# Patient Record
Sex: Male | Born: 1965 | Race: Black or African American | Hispanic: No | Marital: Single | Smoking: Current every day smoker
Health system: Southern US, Community
[De-identification: ages and names within clinical notes are randomized; demographics above are authoritative.]

## PROBLEM LIST (undated history)

## (undated) DIAGNOSIS — I10 Essential (primary) hypertension: Secondary | ICD-10-CM

## (undated) DIAGNOSIS — E119 Type 2 diabetes mellitus without complications: Secondary | ICD-10-CM

## (undated) DIAGNOSIS — I671 Cerebral aneurysm, nonruptured: Secondary | ICD-10-CM

## (undated) HISTORY — PX: CEREBRAL ANEURYSM REPAIR: SHX164

---

## 2011-06-01 ENCOUNTER — Emergency Department (HOSPITAL_COMMUNITY)
Admission: EM | Admit: 2011-06-01 | Discharge: 2011-06-01 | Disposition: A | Discharge status: Home / Self Care | Payer: Self-pay | Attending: Emergency Medicine | Admitting: Emergency Medicine

## 2011-06-01 ENCOUNTER — Emergency Department (HOSPITAL_COMMUNITY): Payer: Self-pay

## 2011-06-01 DIAGNOSIS — Z8679 Personal history of other diseases of the circulatory system: Secondary | ICD-10-CM | POA: Insufficient documentation

## 2011-06-01 DIAGNOSIS — R51 Headache: Secondary | ICD-10-CM | POA: Insufficient documentation

## 2011-06-01 DIAGNOSIS — R112 Nausea with vomiting, unspecified: Secondary | ICD-10-CM | POA: Insufficient documentation

## 2011-06-01 DIAGNOSIS — I10 Essential (primary) hypertension: Secondary | ICD-10-CM | POA: Insufficient documentation

## 2011-07-27 ENCOUNTER — Ambulatory Visit (HOSPITAL_COMMUNITY): Payer: Medicaid Other

## 2011-07-27 ENCOUNTER — Inpatient Hospital Stay (HOSPITAL_COMMUNITY)
Admission: EM | Admit: 2011-07-27 | Discharge: 2011-07-29 | DRG: 305 | Disposition: A | Discharge status: Home / Self Care | Payer: Medicaid Other | Attending: Family Medicine | Admitting: Family Medicine

## 2011-07-27 ENCOUNTER — Inpatient Hospital Stay (INDEPENDENT_AMBULATORY_CARE_PROVIDER_SITE_OTHER)
Admission: RE | Admit: 2011-07-27 | Discharge: 2011-07-27 | Disposition: A | Discharge status: Home / Self Care | Payer: Medicaid Other | Source: Ambulatory Visit | Attending: Family Medicine | Admitting: Family Medicine

## 2011-07-27 ENCOUNTER — Emergency Department (HOSPITAL_COMMUNITY): Payer: Medicaid Other

## 2011-07-27 ENCOUNTER — Encounter (HOSPITAL_COMMUNITY): Payer: Self-pay | Admitting: Family Medicine

## 2011-07-27 DIAGNOSIS — E662 Morbid (severe) obesity with alveolar hypoventilation: Secondary | ICD-10-CM | POA: Diagnosis present

## 2011-07-27 DIAGNOSIS — I671 Cerebral aneurysm, nonruptured: Secondary | ICD-10-CM

## 2011-07-27 DIAGNOSIS — Z8249 Family history of ischemic heart disease and other diseases of the circulatory system: Secondary | ICD-10-CM

## 2011-07-27 DIAGNOSIS — I10 Essential (primary) hypertension: Principal | ICD-10-CM | POA: Diagnosis present

## 2011-07-27 DIAGNOSIS — E119 Type 2 diabetes mellitus without complications: Secondary | ICD-10-CM | POA: Diagnosis present

## 2011-07-27 DIAGNOSIS — R0600 Dyspnea, unspecified: Secondary | ICD-10-CM

## 2011-07-27 DIAGNOSIS — R079 Chest pain, unspecified: Secondary | ICD-10-CM

## 2011-07-27 DIAGNOSIS — R609 Edema, unspecified: Secondary | ICD-10-CM

## 2011-07-27 DIAGNOSIS — R0902 Hypoxemia: Secondary | ICD-10-CM

## 2011-07-27 DIAGNOSIS — R0602 Shortness of breath: Secondary | ICD-10-CM

## 2011-07-27 DIAGNOSIS — F17200 Nicotine dependence, unspecified, uncomplicated: Secondary | ICD-10-CM | POA: Diagnosis present

## 2011-07-27 DIAGNOSIS — R7989 Other specified abnormal findings of blood chemistry: Secondary | ICD-10-CM | POA: Diagnosis present

## 2011-07-27 DIAGNOSIS — R0989 Other specified symptoms and signs involving the circulatory and respiratory systems: Secondary | ICD-10-CM

## 2011-07-27 DIAGNOSIS — R0609 Other forms of dyspnea: Secondary | ICD-10-CM

## 2011-07-27 DIAGNOSIS — R600 Localized edema: Secondary | ICD-10-CM

## 2011-07-27 DIAGNOSIS — I872 Venous insufficiency (chronic) (peripheral): Secondary | ICD-10-CM | POA: Diagnosis present

## 2011-07-27 HISTORY — DX: Cerebral aneurysm, nonruptured: I67.1

## 2011-07-27 HISTORY — DX: Type 2 diabetes mellitus without complications: E11.9

## 2011-07-27 HISTORY — DX: Essential (primary) hypertension: I10

## 2011-07-27 LAB — CBC
Platelets: 197 10*3/uL (ref 150–400)
RBC: 5.9 MIL/uL — ABNORMAL HIGH (ref 4.22–5.81)
RDW: 15.5 % (ref 11.5–15.5)
WBC: 9 10*3/uL (ref 4.0–10.5)

## 2011-07-27 LAB — POCT I-STAT TROPONIN I: Troponin i, poc: 0 ng/mL (ref 0.00–0.08)

## 2011-07-27 LAB — OSMOLALITY, URINE: Osmolality, Ur: 753 mOsm/kg (ref 390–1090)

## 2011-07-27 LAB — HEPATIC FUNCTION PANEL
ALT: 56 U/L — ABNORMAL HIGH (ref 0–53)
Bilirubin, Direct: <0.1 mg/dL (ref 0.0–0.3)

## 2011-07-27 LAB — URINALYSIS, ROUTINE W REFLEX MICROSCOPIC
Bilirubin Urine: NEGATIVE
Leukocytes, UA: NEGATIVE
Nitrite: NEGATIVE
Specific Gravity, Urine: 1.015 (ref 1.005–1.030)
Urobilinogen, UA: 1 mg/dL (ref 0.0–1.0)

## 2011-07-27 LAB — BASIC METABOLIC PANEL
Calcium: 9 mg/dL (ref 8.4–10.5)
GFR calc Af Amer: 88 mL/min — ABNORMAL LOW (ref >90)
GFR calc non Af Amer: 76 mL/min — ABNORMAL LOW (ref >90)
Potassium: 3.8 mEq/L (ref 3.5–5.1)
Sodium: 139 mEq/L (ref 135–145)

## 2011-07-27 LAB — DIFFERENTIAL
Basophils Absolute: 0 10*3/uL (ref 0.0–0.1)
Eosinophils Absolute: 0.3 10*3/uL (ref 0.0–0.7)
Eosinophils Relative: 3 % (ref 0–5)
Neutrophils Relative %: 65 % (ref 43–77)

## 2011-07-27 LAB — URINE MICROSCOPIC-ADD ON

## 2011-07-27 LAB — PRO B NATRIURETIC PEPTIDE: Pro B Natriuretic peptide (BNP): 18.6 pg/mL (ref 0–125)

## 2011-07-27 LAB — TROPONIN I: Troponin I: <0.3 ng/mL (ref <0.30)

## 2011-07-27 LAB — D-DIMER, QUANTITATIVE: D-Dimer, Quant: 0.69 ug/mL-FEU — ABNORMAL HIGH (ref 0.00–0.48)

## 2011-07-27 LAB — SODIUM, URINE, RANDOM: Sodium, Ur: 84 mEq/L

## 2011-07-27 LAB — PROTEIN / CREATININE RATIO, URINE: Protein Creatinine Ratio: 0.13 (ref 0.00–0.15)

## 2011-07-27 MED ORDER — IOHEXOL 350 MG/ML SOLN
100.0000 mL | Freq: Once | INTRAVENOUS | Status: AC | PRN
  Administered 2011-07-27: 100 mL via INTRAVENOUS

## 2011-07-27 NOTE — H&P (Signed)
[facility] Admission History and Physical  Patient name: Jake Williams Medical record number: [id number] Date of birth: 08/23/1966 Age: 45 y.o. Gender: male  Primary Care Provider: No primary provider on file.  Chief Complaint: Chest pain and LE edema History of Present Illness: Jake Williams is a 45 y.o. year old male presenting with chest pain, progressive shortness of breath, and lower extremity edema. 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 At that time he was diagnosed with high blood pressure. He has not been able to begin to see a doctor due to problems with obtaining Medicaid since that time. At that time he was placed on a low dose of lisinopril which she has continued. He went to the dentist today to have a tooth pulled and was told that they would not do it do to his elevated blood pressure. As he also had an episode of chest pain this morning his fianc brought him to the emergency department. Upon arrival to the emergency department he was chest pain-free, but had significant shortness of breath with exertion, and significant lower extremity edema. His initial point-of-care troponins were negative, his EKG was relatively unremarkable, showing only some T-wave inversions in the lateral leads, his BNP was normal, and a CTA of the chest was unremarkable.  Upon further discussion with the patient, he reports that his edema has been progressive for some time, although he is unable to pinpoint an exact time course. His shortness of breath has been progressing over the last several weeks. He now has paroxysmal nocturnal dyspnea and is sleeping sitting up. His chest pain episode this morning was at about 5 AM, occurred while at rest, was substernal with no radiation, and was a feeling of tightness. It lasted for approximately 30 minutes and abated  spontaneously. He has not had any  additional episodes since that time. He does have periodic drops in his oxygen saturation into the mid 80s with simply talking with me.  Pt's fiancee does report about a 40 lb weight gain over the last several months.  Patient Active Problem List  Diagnoses  . Edema of both legs  . SOB (shortness of breath)  . Chest pain  . HTN (hypertension)  . DM (diabetes mellitus)  . Cerebral aneurysm   Past Medical History: Past Medical History  Diagnosis Date  . HTN (hypertension) 07/27/2011  . DM (diabetes mellitus) 07/27/2011  . Cerebral aneurysm 07/27/2011    Past Surgical History: Past Surgical History  Procedure Date  . Cerebral aneurysm repair     Social History: History   Social History  . Marital Status: Single    Spouse Name: N/A    Number of Children: N/A  . Years of Education: N/A   Social History Main Topics  . Smoking status: Current Everyday Smoker -- 0.2 packs/day  . Smokeless tobacco: None  . Alcohol Use: 7.2 oz/week    12 Cans of beer per week  . Drug Use: No  . Sexually Active: Yes   Other Topics Concern  . None   Social History Narrative  . None    Family History: Family History  Problem Relation Age of Onset  . Coronary artery disease    . Cerebral aneurysm    . Hypertension      Allergies: No Known Allergies  Current Facility-Administered Medications  Medication Dose Route Frequency Provider Last Rate Last Dose  .  iohexol (OMNIPAQUE) 350 MG/ML injection 100 mL  100 mL Intravenous Once PRN Medication Radiologist   100 mL at 07/27/11 1742   Current Outpatient Prescriptions  Medication Sig Dispense Refill  . lisinopril (PRINIVIL,ZESTRIL) 10 MG tablet Take 10 mg by mouth daily.         Review Of Systems: Per HPI with the following additions: No rashes, visual changes, diarrhea, hematuria, hematemesis, or cough. Chest pain, shortness of breath, or lower extremity edema as previously described. He has had  periodic headaches over the last several months, but does not have one currently. Otherwise 12 point review of systems was performed and was unremarkable.  Physical Exam: Pulse: 68  Blood Pressure: 168/108 RR: 18   O2: 88-92 RA Temp: 98  General: alert, cooperative, appears stated age and breathing somewhat heavily HEENT: PERRLA, extra ocular movement intact, sclera clear, anicteric, oropharynx clear, no lesions and neck supple with midline trachea Heart: S1, S2 normal, no murmur, rub or gallop, regular rate and rhythm Lungs: mildly labored breathing, occasional wheezes, occasional crackles at both bases, mildly decreased air movement Abdomen: abdomen is soft without significant tenderness, masses, organomegaly or guarding but is quite protuberant Extremities: 2-3+ edema both legs to the level of the knee.  No venous stasis changes. Skin:no rashes, no ecchymoses, no petechiae, no nodules, no jaundice Neurology: normal without focal findings, mental status, speech normal, alert and oriented x3, PERLA, reflexes normal and symmetric and sensation grossly normal  Labs and Imaging:  Results for orders placed during the hospital encounter of 07/27/11 (from the past 24 hour(s))  PRO B NATRIURETIC PEPTIDE     Status: Normal   Collection Time   07/27/11  2:36 PM      Component Value Range   BNP, POC 18.6  0 - 125 (pg/mL)  DIFFERENTIAL     Status: Normal   Collection Time   07/27/11  2:37 PM      Component Value Range   Neutrophils Relative 65  43 - 77 (%)   Neutro Abs 5.8  1.7 - 7.7 (K/uL)   Lymphocytes Relative 24  12 - 46 (%)   Lymphs Abs 2.2  0.7 - 4.0 (K/uL)   Monocytes Relative 8  3 - 12 (%)   Monocytes Absolute 0.7  0.1 - 1.0 (K/uL)   Eosinophils Relative 3  0 - 5 (%)   Eosinophils Absolute 0.3  0.0 - 0.7 (K/uL)   Basophils Relative 0  0 - 1 (%)   Basophils Absolute 0.0  0.0 - 0.1 (K/uL)  CBC     Status: Abnormal   Collection Time   07/27/11  2:37 PM      Component Value Range    WBC 9.0  4.0 - 10.5 (K/uL)   RBC 5.90 (*) 4.22 - 5.81 (MIL/uL)   Hemoglobin 17.7 (*) 13.0 - 17.0 (g/dL)   HCT 53.1 (*) 39.0 - 52.0 (%)   MCV 90.0  78.0 - 100.0 (fL)   MCH 30.0  26.0 - 34.0 (pg)   MCHC 33.3  30.0 - 36.0 (g/dL)   RDW 15.5  11.5 - 15.5 (%)   Platelets 197  150 - 400 (K/uL)  BASIC METABOLIC PANEL     Status: Abnormal   Collection Time   07/27/11  2:37 PM      Component Value Range   Sodium 139  135 - 145 (mEq/L)   Potassium 3.8  3.5 - 5.1 (mEq/L)   Chloride 106  96 - 112 (mEq/L)  CO2 25  19 - 32 (mEq/L)   Glucose, Bld 93  70 - 99 (mg/dL)   BUN 12  6 - 23 (mg/dL)   Creatinine, Ser 1.16  0.50 - 1.35 (mg/dL)   Calcium 9.0  8.4 - 10.5 (mg/dL)   GFR calc non Af Amer 76 (*) >90 (mL/min)   GFR calc Af Amer 88 (*) >90 (mL/min)  HEPATIC FUNCTION PANEL     Status: Abnormal   Collection Time   07/27/11  2:37 PM      Component Value Range   Total Protein 7.3  6.0 - 8.3 (g/dL)   Albumin 3.1 (*) 3.5 - 5.2 (g/dL)   AST 46 (*) 0 - 37 (U/L)   ALT 56 (*) 0 - 53 (U/L)   Alkaline Phosphatase 189 (*) 39 - 117 (U/L)   Total Bilirubin 0.3  0.3 - 1.2 (mg/dL)   Bilirubin, Direct <0.1  0.0 - 0.3 (mg/dL)   Indirect Bilirubin NOT CALCULATED  0.3 - 0.9 (mg/dL)  POCT I-STAT TROPONIN I     Status: Normal   Collection Time   07/27/11  3:04 PM      Component Value Range   Troponin i, poc 0.00  0.00 - 0.08 (ng/mL)   Comment 3           D-DIMER, QUANTITATIVE     Status: Abnormal   Collection Time   07/27/11  3:20 PM      Component Value Range   D-Dimer, Quant 0.69 (*) 0.00 - 0.48 (ug/mL-FEU)   CTA shows no PE, no acute abnormality. EKG is relatively unremarkable, only showing some T-wave inversions in the lateral leads.  Assessment and Plan: Jake Williams is a 45 y.o. year old male presenting with chest pain, shortness of breath, and lower extremity edema. 1. Chest pain: As the patient is hypertensive, a smoker, and reports a history of at least prediabetes, he is high enough risk  to rule out from a cardiac standpoint. I will obtain a standard cardiac rule out on him including all standard labs. Thus far, only abnormality that we have here is an EKG demonstrating some T wave inversions in the lateral leads. 2. Shortness of breath: The patient is a history of a progressive lower extremity edema along with progressive shortness of breath. At this point in time he is having to sleep upright, and is having significant shortness of breath on exertion. His CTA did not show pulmonary embolism. Interestingly, it also doesn't appear to show significant pulmonary vascular congestion or pleural effusions. The patient's BNP is also within normal range making heart failure less likely. The patient has had a brain procedure done within the last year, so the possibility of an SIADH cannot be excluded. However, the patient's sodium is normal which would tend to lean against this. The patient's albumin is mildly low, however this is not enough to explain his lower extremity edema. He does report that he has had edema like this at some time in the past, but none until relatively recently.  Will obtain a 2d echo in the AM. 3. Lower extremity edema: The patient has marked edema to the level of the knee. It is bilateral, symmetric, pitting, and not associated with any pain. As previously noted, his sodium is normal and his albumin is only mildly low. His BNP is also not elevated. His only medication is lisinopril, so this would make a medication effect less likely. For the moment, we will obtain daily standing weights, and strict  I.'s and O.'s. We will observe and see how he does overnight.  Will obtain a 24 hr urine, urine osmolality, urine Na/Cr and Pr/Cr ratio along with an abdominal US to r/o cirrhosis.  Pt/INR to assess liver synthetic fun. 4. Elevated LFTs: This may be related to his chronic alcohol use. I wonder if he is using a fair amount more than he admits to. We will recheck these in the  morning. 5. Hypertension: The patient has a markedly elevated diastolic blood pressure in the emergency department. He is reporting that he has had periodic headaches for the last number of weeks. It's obvious that his lisinopril at 10 mg it is not enough to maintain his blood pressure. I will begin treatment with HCTZ in addition to continuing his lisinopril. I will increase his lisinopril dose to 20 mg daily and monitor. I will also give him a when necessary dose of hydralazine for diastolics greater than [id number]. 6. Prediabetes: Obtain A1c in the AM. 7. H/O Cerebral aneurysm: Monitor vitals and neuro status QShift. 8. FEN/GI: HHD 9. Prophylaxis: SQH 10. Disposition: Pending workup.  Naylor 07/27/2011, 8:19 PM

## 2011-07-28 ENCOUNTER — Inpatient Hospital Stay (HOSPITAL_COMMUNITY): Payer: Medicaid Other

## 2011-07-28 DIAGNOSIS — R072 Precordial pain: Secondary | ICD-10-CM

## 2011-07-28 LAB — COMPREHENSIVE METABOLIC PANEL
BUN: 13 mg/dL (ref 6–23)
Calcium: 8.8 mg/dL (ref 8.4–10.5)
GFR calc Af Amer: >90 mL/min (ref >90)
Glucose, Bld: 98 mg/dL (ref 70–99)
Total Protein: 7.5 g/dL (ref 6.0–8.3)

## 2011-07-28 LAB — BLOOD GAS, ARTERIAL
Bicarbonate: 24.4 mEq/L — ABNORMAL HIGH (ref 20.0–24.0)
Patient temperature: 98.6
TCO2: 25.6 mmol/L (ref 0–100)
pH, Arterial: 7.421 (ref 7.350–7.450)

## 2011-07-28 LAB — HEPATIC FUNCTION PANEL
AST: 43 U/L — ABNORMAL HIGH (ref 0–37)
Albumin: 3 g/dL — ABNORMAL LOW (ref 3.5–5.2)
Total Bilirubin: 0.4 mg/dL (ref 0.3–1.2)

## 2011-07-28 LAB — CBC
HCT: 55.8 % — ABNORMAL HIGH (ref 39.0–52.0)
MCHC: 34.1 g/dL (ref 30.0–36.0)
MCV: 90.4 fL (ref 78.0–100.0)
RDW: 15.7 % — ABNORMAL HIGH (ref 11.5–15.5)

## 2011-07-28 NOTE — Progress Notes (Signed)
Family Medicine Teaching Service Daily Resident Note  Patient name: Jake Williams Medical record number: [id number] Date of birth: 1965-12-28 Age: 45 y.o. Gender: male  Subjective:  Mr. Buchholtz is feeling some better today.  Denies chest pain.  Still has some shortness of breath.  Reports that many of his respiratory symptoms began around the same time that his 40# weight gain began.    He does report that he has been drinking around a pint of liquor a day; the amount he has reported drinking has increased with each time he is asked about his drinking.  He has been doing this for approximately 1 year since the time of his brain surgery.   I spoke to him regarding his family history further also.  No known pulmonary disease in the family although his father does have issues with "fluid"; of note he does report that in addition to himself and his sister who each had cerebral aneurysms two of his cousins who were brothers both died from brain masses that Mr. Cayer could not elaborate on.    Objective: Vitals: Temp: 97.7oF BP: 176-197/101-182mmHg HR 76bpm RR: 18resp   O2: 91-100% on RA   PE: GENERAL:  Adlut African-American, male, examined in 101.  Alert and appropriate but does dose off during conversations; reports he has been doing this since his brain sx.   In mild respiratory distress HNEENT:  H&N: supple, no adenopathy and trachea midline; some JVD; no hepatojugular reflex OROPHARYNX: lips, mucosa, and tongue normal; teeth and gums normal EYES: Pupils are ~2-80mm reactive; anicteric NOSE: normal THORAX: HEART: prominent S1 and S2 normal, regular rate and rhythm; increased precordial heave; LUNGS: rales and rhonchi ABDOMEN:  +BS, protuberant and distendend, no fluid wave appreciated;  EXTREMITIES: edema 3+/4 with chronic venous stasis changes Labs: Results for orders placed during the hospital encounter of 07/27/11 (from the past 24 hour(s))  PRO B NATRIURETIC PEPTIDE     Status:  Normal   Collection Time   07/27/11  2:36 PM      Component Value Range   BNP, POC 18.6  0 - 125 (pg/mL)  DIFFERENTIAL     Status: Normal   Collection Time   07/27/11  2:37 PM      Component Value Range   Neutrophils Relative 65  43 - 77 (%)   Neutro Abs 5.8  1.7 - 7.7 (K/uL)   Lymphocytes Relative 24  12 - 46 (%)   Lymphs Abs 2.2  0.7 - 4.0 (K/uL)   Monocytes Relative 8  3 - 12 (%)   Monocytes Absolute 0.7  0.1 - 1.0 (K/uL)   Eosinophils Relative 3  0 - 5 (%)   Eosinophils Absolute 0.3  0.0 - 0.7 (K/uL)   Basophils Relative 0  0 - 1 (%)   Basophils Absolute 0.0  0.0 - 0.1 (K/uL)  CBC     Status: Abnormal   Collection Time   07/27/11  2:37 PM      Component Value Range   WBC 9.0  4.0 - 10.5 (K/uL)   RBC 5.90 (*) 4.22 - 5.81 (MIL/uL)   Hemoglobin 17.7 (*) 13.0 - 17.0 (g/dL)   HCT 53.1 (*) 39.0 - 52.0 (%)   MCV 90.0  78.0 - 100.0 (fL)   MCH 30.0  26.0 - 34.0 (pg)   MCHC 33.3  30.0 - 36.0 (g/dL)   RDW 15.5  11.5 - 15.5 (%)   Platelets 197  150 - 400 (K/uL)  BASIC METABOLIC PANEL  Status: Abnormal   Collection Time   07/27/11  2:37 PM      Component Value Range   Sodium 139  135 - 145 (mEq/L)   Potassium 3.8  3.5 - 5.1 (mEq/L)   Chloride 106  96 - 112 (mEq/L)   CO2 25  19 - 32 (mEq/L)   Glucose, Bld 93  70 - 99 (mg/dL)   BUN 12  6 - 23 (mg/dL)   Creatinine, Ser 1.16  0.50 - 1.35 (mg/dL)   Calcium 9.0  8.4 - 10.5 (mg/dL)   GFR calc non Af Amer 76 (*) >90 (mL/min)   GFR calc Af Amer 88 (*) >90 (mL/min)  HEPATIC FUNCTION PANEL     Status: Abnormal   Collection Time   07/27/11  2:37 PM      Component Value Range   Total Protein 7.3  6.0 - 8.3 (g/dL)   Albumin 3.1 (*) 3.5 - 5.2 (g/dL)   AST 46 (*) 0 - 37 (U/L)   ALT 56 (*) 0 - 53 (U/L)   Alkaline Phosphatase 189 (*) 39 - 117 (U/L)   Total Bilirubin 0.3  0.3 - 1.2 (mg/dL)   Bilirubin, Direct <0.1  0.0 - 0.3 (mg/dL)   Indirect Bilirubin NOT CALCULATED  0.3 - 0.9 (mg/dL)  POCT I-STAT TROPONIN I     Status: Normal    Collection Time   07/27/11  3:04 PM      Component Value Range   Troponin i, poc 0.00  0.00 - 0.08 (ng/mL)   Comment 3           D-DIMER, QUANTITATIVE     Status: Abnormal   Collection Time   07/27/11  3:20 PM      Component Value Range   D-Dimer, Quant 0.69 (*) 0.00 - 0.48 (ug/mL-FEU)  URINALYSIS, ROUTINE W REFLEX MICROSCOPIC     Status: Abnormal   Collection Time   07/27/11  9:23 PM      Component Value Range   Color, Urine YELLOW  YELLOW    Appearance CLEAR  CLEAR    Specific Gravity, Urine 1.015  1.005 - 1.030    pH 6.5  5.0 - 8.0    Glucose, UA NEGATIVE  NEGATIVE (mg/dL)   Hgb urine dipstick NEGATIVE  NEGATIVE    Bilirubin Urine NEGATIVE  NEGATIVE    Ketones, ur NEGATIVE  NEGATIVE (mg/dL)   Protein, ur 30 (*) NEGATIVE (mg/dL)   Urobilinogen, UA 1.0  0.0 - 1.0 (mg/dL)   Nitrite NEGATIVE  NEGATIVE    Leukocytes, UA NEGATIVE  NEGATIVE   URINE MICROSCOPIC-ADD ON     Status: Normal   Collection Time   07/27/11  9:23 PM      Component Value Range   Squamous Epithelial / LPF RARE  RARE    WBC, UA 0-2  <3 (WBC/hpf)   RBC / HPF 0-2  <3 (RBC/hpf)   Bacteria, UA RARE  RARE   CREATININE, URINE, RANDOM     Status: Normal   Collection Time   07/27/11  9:23 PM      Component Value Range   Creatinine, Urine 173.84    SODIUM, URINE, RANDOM     Status: Normal   Collection Time   07/27/11  9:23 PM      Component Value Range   Sodium, Ur 84    PROTEIN / CREATININE RATIO, URINE     Status: Normal   Collection Time   07/27/11  9:23 PM  Component Value Range   Creatinine, Urine 174.51     Total Protein, Urine 22.5     PROTEIN CREATININE RATIO 0.13  0.00 - 0.15   OSMOLALITY, URINE     Status: Normal   Collection Time   07/27/11  9:23 PM      Component Value Range   Osmolality, Ur 753  390 - 1090 (mOsm/kg)  PROTIME-INR     Status: Normal   Collection Time   07/27/11 10:07 PM      Component Value Range   Prothrombin Time 13.3  11.6 - 15.2 (seconds)   INR 0.99  0.00 - 1.49    TROPONIN I     Status: Normal   Collection Time   07/27/11 10:07 PM      Component Value Range   Troponin I <0.30  <0.30 (ng/mL)  CBC     Status: Abnormal   Collection Time   07/28/11  3:18 AM      Component Value Range   WBC 7.9  4.0 - 10.5 (K/uL)   RBC 6.17 (*) 4.22 - 5.81 (MIL/uL)   Hemoglobin 19.0 (*) 13.0 - 17.0 (g/dL)   HCT 55.8 (*) 39.0 - 52.0 (%)   MCV 90.4  78.0 - 100.0 (fL)   MCH 30.8  26.0 - 34.0 (pg)   MCHC 34.1  30.0 - 36.0 (g/dL)   RDW 15.7 (*) 11.5 - 15.5 (%)   Platelets 163  150 - 400 (K/uL)  TROPONIN I     Status: Normal   Collection Time   07/28/11  3:18 AM      Component Value Range   Troponin I <0.30  <0.30 (ng/mL)  COMPREHENSIVE METABOLIC PANEL     Status: Abnormal   Collection Time   07/28/11  3:18 AM      Component Value Range   Sodium 137  135 - 145 (mEq/L)   Potassium 4.3  3.5 - 5.1 (mEq/L)   Chloride 105  96 - 112 (mEq/L)   CO2 22  19 - 32 (mEq/L)   Glucose, Bld 98  70 - 99 (mg/dL)   BUN 13  6 - 23 (mg/dL)   Creatinine, Ser 1.07  0.50 - 1.35 (mg/dL)   Calcium 8.8  8.4 - 10.5 (mg/dL)   Total Protein 7.5  6.0 - 8.3 (g/dL)   Albumin 3.0 (*) 3.5 - 5.2 (g/dL)   AST 46 (*) 0 - 37 (U/L)   ALT 61 (*) 0 - 53 (U/L)   Alkaline Phosphatase 173 (*) 39 - 117 (U/L)   Total Bilirubin 0.3  0.3 - 1.2 (mg/dL)   GFR calc non Af Amer 83 (*) >90 (mL/min)   GFR calc Af Amer >90  >90 (mL/min)    Imaging: Dg Chest 2 View  07/27/2011  *RADIOLOGY REPORT*  Clinical Data: Shortness of breath, swelling, chest pain  CHEST - 2 VIEW  Comparison: None  Findings: No active infiltrate or effusion is seen. Mediastinal contours appear grossly normal. The heart is within upper limits of normal.  No acute bony abnormality is noted.  IMPRESSION: No active lung disease.  Original Report Authenticated By: Joretta Bachelor, M.D.   Ct Angio Chest W/cm &/or Wo Cm  07/27/2011  *RADIOLOGY REPORT*  Clinical Data:  Short of breath.  Bilateral leg swelling.  CT ANGIOGRAPHY CHEST WITH  CONTRAST  Technique:  Multidetector CT imaging of the chest was performed using the standard protocol during bolus administration of intravenous contrast.  Multiplanar CT image reconstructions including  MIPs were obtained to evaluate the vascular anatomy.  Contrast: 138mL OMNIPAQUE IOHEXOL 350 MG/ML IV SOLN  Comparison:  07/27/2011  Findings:  Negative for pulmonary embolism.  Mild cardiac enlargement without pericardial effusion.  Lungs are clear without infiltrate or effusion.  Mild scarring in the right middle lobe.  Mild scarring in the apices.  Negative for mass or adenopathy.  Review of the MIP images confirms the above findings.  IMPRESSION: Negative for pulmonary embolism.  No acute abnormality.  Original Report Authenticated By: Truett Perna, M.D.   EKG: 10/31 AM - Some persisent T-wave inversion; persisently in I, aVL and V6; was in v3-v6 earlier   Assessment & Plan: Teofilo Paille is a 45 y.o. male who was admitted for Chest pain, progressive dyspnea and hypertension.    1. Cardiorespiratory  (Chest pain, progressive dyspnea, PND) - concern many of his symptoms could be coming from a cardiac source. Currently cycling CEs to r/o ACS; negative thus far. We will obtain an ABG to investigate if hypoxia is cause of polycythemia. Concern this could be pulmonary HTN.  May need both R and L heart cath if ECHO is revealing.  [ ]  CEs  [ ]  ABG  [ ]  ECHO  2. Cardiovascular (HTN, prior intracranial aneurysm) - controlling BP with increased ACE and hydralazine for now; will start Lasix to provide some diuresis  *Lisinopril 20mg  po   *Hydralazine  *Lasix  3. Metabolic: (Prediabetes, Elevated LFTs, EtOH abuse)  Concern for EtOH associated liver disease; will start CIWA protocol; would likely benefit from [facility] interview for potential detox  [ ]  A1c  [ ]  Abdominal U/s  *CIWA  4. Rheumatolic:  We must consider this could be collegen vascular disease realated especially in setting of strong family  history of vascular issues.    5. Fluid Status - overtly volume overloaded although normal pro-BNP.  will start lasix as above; consider torsemide if no response; FENA calculated to be 0.4 indicating pre-renal but likely 2o/2 poor cardiac output; must consider Renal artery stenosis if this could be collagen vascular disease.    FEN/GI: CHO modified; IVFs - KVO - on lasix PPx: SQH  ---------- DISPO:  Continuing further workup;  ECHO and abdominal u/s today;  Teresa Coombs, DO [place], [place] Resident [phone]

## 2011-07-29 LAB — DIFFERENTIAL
Eosinophils Absolute: 0.1 10*3/uL (ref 0.0–0.7)
Lymphocytes Relative: 15 % (ref 12–46)
Lymphs Abs: 1.6 10*3/uL (ref 0.7–4.0)
Monocytes Relative: 10 % (ref 3–12)
Neutro Abs: 8.1 10*3/uL — ABNORMAL HIGH (ref 1.7–7.7)
Neutrophils Relative %: 75 % (ref 43–77)

## 2011-07-29 LAB — CBC
HCT: 52.6 % — ABNORMAL HIGH (ref 39.0–52.0)
Hemoglobin: 17.2 g/dL — ABNORMAL HIGH (ref 13.0–17.0)
MCH: 29.5 pg (ref 26.0–34.0)
MCV: 90.2 fL (ref 78.0–100.0)
RBC: 5.83 MIL/uL — ABNORMAL HIGH (ref 4.22–5.81)

## 2011-07-29 LAB — COMPREHENSIVE METABOLIC PANEL
ALT: 56 U/L — ABNORMAL HIGH (ref 0–53)
AST: 32 U/L (ref 0–37)
Albumin: 3 g/dL — ABNORMAL LOW (ref 3.5–5.2)
Alkaline Phosphatase: 118 U/L — ABNORMAL HIGH (ref 39–117)
BUN: 13 mg/dL (ref 6–23)
CO2: 28 mEq/L (ref 19–32)
Calcium: 8.9 mg/dL (ref 8.4–10.5)
Chloride: 100 mEq/L (ref 96–112)
Creatinine, Ser: 1.44 mg/dL — ABNORMAL HIGH (ref 0.50–1.35)
GFR calc Af Amer: 67 mL/min — ABNORMAL LOW (ref >90)
GFR calc non Af Amer: 58 mL/min — ABNORMAL LOW (ref >90)
Glucose, Bld: 93 mg/dL (ref 70–99)
Potassium: 3.6 mEq/L (ref 3.5–5.1)
Sodium: 136 mEq/L (ref 135–145)
Total Bilirubin: 0.5 mg/dL (ref 0.3–1.2)
Total Protein: 7.4 g/dL (ref 6.0–8.3)

## 2011-07-29 LAB — HEPATITIS PANEL, ACUTE
HCV Ab: REACTIVE — AB
Hepatitis B Surface Ag: POSITIVE — AB

## 2011-07-29 NOTE — Discharge Summary (Signed)
Discharge Summary 07/29/2011 3:41 PM  Jake Williams DOB: 02/10/1966 MRN: [id number]  Date of Admission: 07/27/2011 Date of Discharge: 07/29/2011  PCP: No primary provider on file. Consultants: none  Reason for Admission: hypertension  Discharge Diagnosis 1. Hypertension 2. Obesity hypoventilation syndrome 3. Elevated liver enzymes 4. Peripheral edema, likely secondary to venous stasis  Hospital Course: Mr. Kiker is a 45 year old man who presents to the ED after an elevated BP at the dentist office.  He also has a 4 month history of progressive dyspnea, peripheral edema, and 40lb weight gain.  1. Hypertension: He was hypertensive to 200/110s on admission. His home lisinopril dose was increased to 20mg  daily and he received multiple doses of IV hydralazine in the first 24hrs of his admission.  He did receive one dose of HCTZ prior to switching to verapamil.  An echo was done which showed moderate to severe LVH with dynamic obliteration, EF 70-75%, and grade I diastolic dysfunction.  At discharge, his BP was in the [id number] range systolic.  2. Obesity hypoventilation syndrome: He came in with progressive dyspnea, however there is no evidence for cardiac or pulmonary cause of the dyspnea on CXR or CTA.  With his history of cocaine use, he is at risk for pulmonary HTN; however, his echo had a normal RV.  He required occasional oxygen, however his O2 saturations have remain greater than 90% on RA on day of discharge and ambulatory O2 sats were 93-94%. He may also have a component of obstructive sleep apnea with an Epworth sleepiness scale of 12, hx of snoring and hx of rarely waking up with a choking sensation.  3. Elevated liver enzymes: He was incidentally noted to have elevated liver enzymes on admission.  These have trending down during the hospitalization.  He did have some mild RUQ tenderness on exam.  A hepatitis panel was positive for hepatitis C antibody and hepatitis B surface  antigen.  The rest was pending at time of discharge.  He does also have a history of excessive alcohol use.  4. Leg edema: This is likely secondary to venous stasis.  It responded well to diuresis with torsemide; however, his creatinine bumped to 1.44 after the torsemide, and he was switched to compression stockings.  Procedures: none  Discharge Medications New 1. Verapamil SR 180mg  daily 2. Lisinopril 20mg  daily Continued none Discontinued 1. Lisinopril 10mg  daily  Pertinent Hospital Labs Pro-BNP: 18.6 Troponin: neg x3 D-dimer: 0.69 (mild elevation) HbA1c: 5.9 TSH: 0.883 ESR: 2  CTA: Negative for PE.  NO acute abnormality.  CXR: No active lung disease  Liver ultrasound: no sonographic evidence of portal venous hypertension  ECHO: LV wall thickness was increased in a pattern of moderate to severe LVH.  Systolic function was vigorous.  The estimated EF was 70-75%.  There was dynamic obstruction, with mid-cavity obliteration.  Wall motion was normal; there were no regional wall motion abnormalities.  Doppler parameters are consistent with abnormal left ventricular relaxation (grade 1 diastolic dysfunction).   Discharge instructions: He was instructed to call [facility] or return to the ED for worsening dyspnea, leg edema or chest pain.  Condition at discharge: stable  Disposition: home  Pending Tests: hepatitis panel  Follow up:  Dr. Darrick Grinder at [facility] on Tuesday 11/6 at 1pm  Follow up Issues:  1. Please check a BMP for creatinine 2. Consider a sleep study to evaluated for sleep apnea 3. Follow up on dyspnea and edema 4. Go over hepatitis panel with patient  Jake Williams,  Jake Williams 07/29/2011, 3:41 PM

## 2011-08-03 ENCOUNTER — Inpatient Hospital Stay: Payer: Medicaid Other | Admitting: Emergency Medicine

## 2011-08-09 ENCOUNTER — Inpatient Hospital Stay: Payer: Medicaid Other | Admitting: Emergency Medicine

## 2011-08-24 ENCOUNTER — Encounter: Payer: Self-pay | Admitting: Emergency Medicine

## 2011-08-24 ENCOUNTER — Ambulatory Visit (INDEPENDENT_AMBULATORY_CARE_PROVIDER_SITE_OTHER): Payer: Medicaid Other | Admitting: Emergency Medicine

## 2011-08-24 DIAGNOSIS — I10 Essential (primary) hypertension: Secondary | ICD-10-CM

## 2011-08-24 DIAGNOSIS — M25519 Pain in unspecified shoulder: Secondary | ICD-10-CM

## 2011-08-24 DIAGNOSIS — M25511 Pain in right shoulder: Secondary | ICD-10-CM

## 2011-08-24 DIAGNOSIS — N529 Male erectile dysfunction, unspecified: Secondary | ICD-10-CM | POA: Insufficient documentation

## 2011-08-24 DIAGNOSIS — R609 Edema, unspecified: Secondary | ICD-10-CM

## 2011-08-24 DIAGNOSIS — R600 Localized edema: Secondary | ICD-10-CM

## 2011-08-24 LAB — COMPREHENSIVE METABOLIC PANEL
ALT: 69 U/L — ABNORMAL HIGH (ref 0–53)
Albumin: 3.7 g/dL (ref 3.5–5.2)
BUN: 15 mg/dL (ref 6–23)
CO2: 25 mEq/L (ref 19–32)
Calcium: 9.5 mg/dL (ref 8.4–10.5)
Chloride: 106 mEq/L (ref 96–112)
Creat: 1.41 mg/dL — ABNORMAL HIGH (ref 0.50–1.35)
Potassium: 4.1 mEq/L (ref 3.5–5.3)

## 2011-08-24 MED ORDER — FUROSEMIDE 20 MG PO TABS: 20.0000 mg | ORAL_TABLET | Freq: Every day | ORAL | Status: DC

## 2011-08-24 MED ORDER — LISINOPRIL 20 MG PO TABS: 20.0000 mg | ORAL_TABLET | Freq: Every day | ORAL | Status: DC

## 2011-08-24 MED ORDER — NAPROXEN 500 MG PO TABS: 500.0000 mg | ORAL_TABLET | Freq: Two times a day (BID) | ORAL | Status: AC

## 2011-08-24 NOTE — Assessment & Plan Note (Signed)
Discussed adjusting BP meds as verapamil can cause erectile dysfunction.  Also discussed that as his health and blood pressure improve, his sex drive will also likely improve.  Patient and partner okay with holding off on starting Viagra.

## 2011-08-24 NOTE — Assessment & Plan Note (Signed)
Improved from the hospital.  Will change verapamil to lasix for leg edema and erectile dysfunction.  He is to return in 2 weeks for lab work and BP check.  We will also obtain a sleep study for possible sleep apnea.  He is to follow up with me in 2-3 months.

## 2011-08-24 NOTE — Progress Notes (Signed)
  Subjective:    Patient ID: Jake Williams, male    DOB: [birth year], 45 y.o.   MRN: [id number]  HPI Jake Williams presents today for hospital follow up of hypertension.  He is here with his partner, who helps with the history.   Hypertension: BP at home in the 150-160s/90s.  Experiencing fewer headaches.  No chest pain.  Compliant with lisinopril 20mg  and verapamil 120mg .  Continues to have lower extremity edema that is causing some discomfort.  Right Shoulder Pain: Been going on for several months.  Stabbing pain that he has been taking aleve and advil for with mild improvement.  Worse with overhead activity.  Erectile Dysfunction: Complains of decreased sexual drive and difficulty getting/maintaining an erection for the last several months, worse since the hospitalization.   Review of Systems See HPI, otherwise negative    Objective:   Physical Exam Vitals: reviewed General: overweight man, alert, no distress HEENT: sclera white, MMM, dental caries CV: RRR, no murmurs, gallops Pulm: CTAB, no wheezes Abd: +BS, soft, non-tender, mild distention Ext: 2+ pitting edema to knees; 2+ pedal pulses; no skin discoloration     Assessment & Plan:

## 2011-08-24 NOTE — Assessment & Plan Note (Signed)
Will start low-dose lasix.  He is to follow up in 2 weeks to recheck electrolytes.

## 2011-08-24 NOTE — Assessment & Plan Note (Signed)
Likely rotator cuff.  Will give naprosyn 500mg  BID for 5 days.  Instructed to use heat or ice and tylenol for pain as well.  Discussed why I do not want him taking naprosyn for a long time.

## 2011-08-24 NOTE — Patient Instructions (Addendum)
Blood pressure -please continue to check your blood pressure at home -I am stopping the verapamil medication -start taking lasix 20mg  daily (this was sent to your pharmacy) -I have put in a referral for a sleep study to evaluate for sleep apnea  Please follow up in 2 weeks to recheck your electrolytes since we started lasix.  This will be a lab appointment.  Follow up with me in 2-3 months for your blood pressure and other concerns.  Shoulder pain I have sent a prescription for Naprosyn to your pharmacy.  Please take this twice a day for the next 5 days.  Do NOT take this medicine every day for more than the 5 days as it might worsen your blood pressure and swelling in your legs.  You can use ice or heat as needed to help with the pain.  You can also take tylenol (acetaminophen) for pain.

## 2011-09-13 ENCOUNTER — Ambulatory Visit (HOSPITAL_BASED_OUTPATIENT_CLINIC_OR_DEPARTMENT_OTHER): Payer: Medicaid Other | Attending: Emergency Medicine | Admitting: Radiology

## 2011-09-13 VITALS — Ht 72.0 in | Wt 300.0 lb

## 2011-09-13 DIAGNOSIS — I10 Essential (primary) hypertension: Secondary | ICD-10-CM

## 2011-09-13 DIAGNOSIS — Z79899 Other long term (current) drug therapy: Secondary | ICD-10-CM | POA: Insufficient documentation

## 2011-09-13 DIAGNOSIS — I4949 Other premature depolarization: Secondary | ICD-10-CM | POA: Insufficient documentation

## 2011-09-13 DIAGNOSIS — R351 Nocturia: Secondary | ICD-10-CM | POA: Insufficient documentation

## 2011-09-13 DIAGNOSIS — G4733 Obstructive sleep apnea (adult) (pediatric): Secondary | ICD-10-CM | POA: Insufficient documentation

## 2011-09-14 ENCOUNTER — Other Ambulatory Visit: Payer: Medicaid Other

## 2011-09-14 DIAGNOSIS — R600 Localized edema: Secondary | ICD-10-CM

## 2011-09-14 LAB — BASIC METABOLIC PANEL
CO2: 32 mEq/L (ref 19–32)
Calcium: 9.6 mg/dL (ref 8.4–10.5)
Chloride: 103 mEq/L (ref 96–112)
Sodium: 140 mEq/L (ref 135–145)

## 2011-09-14 NOTE — Progress Notes (Signed)
BMP DONE TODAY Rina Adney 

## 2011-09-16 ENCOUNTER — Encounter: Payer: Self-pay | Admitting: Emergency Medicine

## 2011-09-18 DIAGNOSIS — I4949 Other premature depolarization: Secondary | ICD-10-CM

## 2011-09-18 DIAGNOSIS — G4733 Obstructive sleep apnea (adult) (pediatric): Secondary | ICD-10-CM

## 2011-09-18 DIAGNOSIS — Z79899 Other long term (current) drug therapy: Secondary | ICD-10-CM

## 2011-09-18 DIAGNOSIS — R351 Nocturia: Secondary | ICD-10-CM

## 2011-09-19 NOTE — Procedures (Signed)
Jake Williams, Jake Williams            ACCOUNT NO.:  [id number]  MEDICAL RECORD NO.:  [id number]          PATIENT TYPE:  OUT  LOCATION:  SLEEP CENTER                 FACILITY:  [facility]  PHYSICIAN:  Clinton D. Annamaria Boots, MD, FCCP, FACPDATE OF BIRTH:  September 09, 1967  DATE OF STUDY:  09/13/2011                           NOCTURNAL POLYSOMNOGRAM  REFERRING PHYSICIAN:  Jaquita Rector, MD  REFERRING PHYSICIAN:  Jaquita Rector, MD  INDICATION FOR STUDY:  Hypersomnia with sleep apnea.  EPWORTH SLEEPINESS SCORE:  24/24.  BMI 40.7, weight 300 pounds, height 72 inches, neck 16.5 inches.  MEDICATIONS:  Home medications are charted and reviewed.  SLEEP ARCHITECTURE:  Total sleep time 299.5 minutes with sleep efficiency 77%.  Stage I was 38.7%, stage II 39.6%, stage III absent, REM 21.7% of total sleep time.  Sleep latency 17 minutes, REM latency 24 minutes, awake after sleep onset 73.5 minutes.  Arousal index 78.7. Sleep was markedly fragmented by sleep stage changes and brief wakings.  Bedtime medication:  Lisinopril and HCTZ taken at 7:45 p.m.  RESPIRATORY DATA:  Apnea/hypotony index (AHI) 78.9 per hour.  A total of 394 events was scored including 285 obstructive apneas, 2 central apneas, 10 mixed apneas, and 97 hypopneas.  Events were not positional. REM AHI 88.6 per hour.  The study was ordered as a diagnostic NPSG protocol without CPAP titration included.  OXYGEN DATA:  Moderately loud snoring with oxygen desaturation to a nadir of 76% and a mean oxygen saturation through the study of 87.9% on room air.  CARDIAC DATA:  Sinus rhythm with PVCs.  MOVEMENT-PARASOMNIA:  No significant movement disturbance.  Bathroom x4.  IMPRESSIONS-RECOMMENDATIONS: 1. Severe obstructive sleep apnea/hypopnea syndrome, apnea/hypopnea     index 78.9 per hour with non-positional events, moderately loud     snoring, and oxygen desaturation to a nadir of 76% with mean oxygen     saturation only 87.9% through the study.  A  total of 128.6 minutes     was recorded during the total recording time (awake and asleep)     with room air saturation less than 88%. 2. The study was ordered as a diagnostic nocturnal polysomnogram     protocol.  Consider early return for dedicated CPAP titration study     or evaluate for alternative management as clinically appropriate. 3. This degree of obstructive sleep apnea is associated with blood     pressure surges which frequently results in nocturia.  Waking     because of nocturia may be further aggravated by taking his     diuretic in the evening.     Clinton D. Annamaria Boots, MD, [facility], [place], Tax adviser of Sleep Medicine Electronically Signed    CDY/MEDQ  D:  09/18/2011 10:41:26  T:  09/19/2011 03:32:46  Job:  [id number]

## 2011-10-11 ENCOUNTER — Ambulatory Visit (INDEPENDENT_AMBULATORY_CARE_PROVIDER_SITE_OTHER): Payer: Medicaid Other | Admitting: Emergency Medicine

## 2011-10-11 ENCOUNTER — Other Ambulatory Visit: Payer: Self-pay

## 2011-10-11 ENCOUNTER — Ambulatory Visit (HOSPITAL_COMMUNITY)
Admission: RE | Admit: 2011-10-11 | Discharge: 2011-10-11 | Disposition: A | Discharge status: Home / Self Care | Payer: Medicaid Other | Source: Ambulatory Visit | Attending: Emergency Medicine | Admitting: Emergency Medicine

## 2011-10-11 ENCOUNTER — Encounter: Payer: Self-pay | Admitting: Emergency Medicine

## 2011-10-11 VITALS — BP 168/102 | HR 80 | Ht 72.0 in | Wt 303.6 lb

## 2011-10-11 DIAGNOSIS — I10 Essential (primary) hypertension: Secondary | ICD-10-CM

## 2011-10-11 DIAGNOSIS — G4733 Obstructive sleep apnea (adult) (pediatric): Secondary | ICD-10-CM | POA: Insufficient documentation

## 2011-10-11 DIAGNOSIS — R9431 Abnormal electrocardiogram [ECG] [EKG]: Secondary | ICD-10-CM | POA: Insufficient documentation

## 2011-10-11 DIAGNOSIS — R079 Chest pain, unspecified: Secondary | ICD-10-CM

## 2011-10-11 MED ORDER — METOPROLOL TARTRATE 25 MG PO TABS: 25.0000 mg | ORAL_TABLET | Freq: Two times a day (BID) | ORAL | Status: DC

## 2011-10-11 NOTE — Patient Instructions (Addendum)
It was wonderful to see you again!  Your sleep study shows you have Sleep Apnea.  This is a condition that causes you to wake up at night due to difficulty breathing.  We are sending you back to the sleep center for CPAP titration.  This is a mask you will wear at night to prevent you from waking up.  It will help you sleep better at night so you aren't so tired during the day and will also help with your blood pressure.  You also mentioned some chest pain today.  I don't think it is your heart, but I am worried enough that we did an EKG in the office (which was unchanged from older EKG) and I am going to send you to a cardiologist for a stress test just to make sure.  Your blood pressure was a little high again today.  This might be because you were out of your medicines last week.  We are going to add another medicine, called Metoprolol.  You will take this twice a day, everyday.  It should help with your blood pressure and also your chest pain (if it is coming from your heart).  PLEASE FOLLOW UP WITH ME IN 2-4 WEEKS SO WE CAN CONTINUE TO GET YOUR BLOOD PRESSURE UNDER BETTER CONTROL.  Sleep Apnea Sleep apnea is a common disorder. The main problem of this disorder is excessive daytime sleepiness and compromised quality of life. This may include social and emotional problems. There are two types of sleep apnea.  Obstructive sleep apnea is when breathing stops due to a blocked airway.   Central sleep apnea is a malfunction of the brain's normal signal to breathe.  SYMPTOMS  Restless sleep.   Falling asleep while driving and/or during the day.   Loss of energy.   Irritability.   Mood or behavior changes.   Loud, heavy snoring.   Morning headaches.   Trouble concentrating.   Forgetfulness.   Anxiety or depression.   Decreased interest in sex.  Not all people with sleep apnea have all of these symptoms. However, people who have a few of these symptoms should visit their caregiver  for an evaluation. Problems related to untreated sleep apnea include:  High blood pressure (hypertension).   Coronary artery disease.   Impotence.   Cognitive dysfunction.   Memory loss.  TREATMENT  For mild cases, treatment may include avoiding sleeping on one's back.   For people with nasal congestion, a decongestant may be prescribed.   Patients with obstructive and central apnea should avoid depressants. This includes alcohol, sedatives and narcotics. Weight loss and diet control are encouraged for overweight patients.   Many serious cases of obstructive sleep apnea can be relieved by a treatment called nasal continuous positive airway pressure (nasal CPAP). Nasal CPAP uses a mask-like device and pump that work together to keep the airway open. The pump delivers air pressure during each breath.   Surgery may help some patients by stopping or reducing the narrowing of the airway due to anatomical defects.  PROGNOSIS  Removing the obstruction usually reverses hypertension and cardiac problems. Untreated, sleep apnea sufferers have a tendency to fall asleep during the day. This is can result in serious accident or loss of ones job. RESEARCH Sleep apnea is currently one of the most active areas of sleep research.  Document Released: 09/03/2002 Document Revised: 05/26/2011 Document Reviewed: 12/30/2005 [facility] Patient Information 2012 [place].

## 2011-10-11 NOTE — Assessment & Plan Note (Addendum)
BP elevated today.  Has missed several doses last week.  Will continue current medications and add metoprolol 50mg  BID.  Will also send for CPAP titration with may help with BP control as well.  Peripheral edema improved.  Would like to eventually change lasix to HCTZ; however, do not want to make too many changes at once as patient's memory is not the best.

## 2011-10-11 NOTE — Progress Notes (Signed)
  Subjective:    Patient ID: Jake Williams, male    DOB: January 16, 1966, 46 y.o.   MRN: [id number]  HPI Jake Williams is here today for follow up of his hypertension.  1. Hypertension: He states has been taking his medications except for about 4 days last week when he ran out.  He started back on his medications on Saturday.  Continues to have mild headaches that he takes tylenol for.  Leg edema is improved from last visit.  Has been "pretty good" about using compression stockings.  Does complain of some intermittent chest pain.  Denies palpitations, worsening dyspnea.  2. Chest Pain: No episodes in the last week.  When he was having the episodes, the pain was located mostly on the left chest, sometimes on the right chest, "pulling" in nature, non-radiating, occurs with activity, lasts 5-10 seconds, resolves with rest.  Associated with mild dyspnea, nausea, sometimes vomiting and diaphoresis after vomiting.  Not currently experiencing any chest pain.  Family history of CAD.    3. Sleep Study results: Study showed severe sleep apnea.  Patient described falling asleep at a [employer] when he had the winning numbers.  Review of Systems Please see HPI, otherwise negative    Objective:   Physical Exam Vitals: reviewed HEENT: sclera white Neck: no JVD, no LAD CV: RRR, no murmurs, gallops Pulm: CTAB, no wheezes, rales Ext: 2+ pitting edema to knees bilaterally Skin: chronic skin changes at ankles     Assessment & Plan:

## 2011-10-11 NOTE — Assessment & Plan Note (Signed)
Atypical.  EKG in office was unchanged from baseline.  Given HTN, smoking, obesity, family history of CAD will refer to cardiology for stress test.

## 2011-10-11 NOTE — Assessment & Plan Note (Signed)
Diagnosed as severe by sleep study.  Will send for CPAP titration.

## 2011-10-12 ENCOUNTER — Telehealth: Payer: Self-pay | Admitting: *Deleted

## 2011-10-12 NOTE — Telephone Encounter (Signed)
Informed patient's fiance of cardiology appt date & time.  Nolene Ebbs, RN

## 2011-10-12 NOTE — Telephone Encounter (Signed)
Waiting for call back. Left message for pt. Please tell pt: APPT AT [facility] 10-22-11 AT 11:15 AM [place] [phone] .[place]

## 2011-10-22 ENCOUNTER — Institutional Professional Consult (permissible substitution): Payer: Medicaid Other | Admitting: Cardiovascular Disease

## 2011-10-27 ENCOUNTER — Ambulatory Visit (HOSPITAL_BASED_OUTPATIENT_CLINIC_OR_DEPARTMENT_OTHER): Payer: Medicaid Other | Attending: Emergency Medicine

## 2011-10-27 VITALS — Ht 72.0 in | Wt 310.0 lb

## 2011-10-27 DIAGNOSIS — G4730 Sleep apnea, unspecified: Secondary | ICD-10-CM | POA: Insufficient documentation

## 2011-10-27 DIAGNOSIS — G4710 Hypersomnia, unspecified: Secondary | ICD-10-CM | POA: Insufficient documentation

## 2011-10-27 DIAGNOSIS — G4733 Obstructive sleep apnea (adult) (pediatric): Secondary | ICD-10-CM

## 2011-10-27 DIAGNOSIS — Z79899 Other long term (current) drug therapy: Secondary | ICD-10-CM | POA: Insufficient documentation

## 2011-10-31 DIAGNOSIS — G4710 Hypersomnia, unspecified: Secondary | ICD-10-CM

## 2011-10-31 DIAGNOSIS — G4730 Sleep apnea, unspecified: Secondary | ICD-10-CM

## 2011-10-31 NOTE — Procedures (Signed)
Jake Williams, Jake Williams            ACCOUNT NO.:  [id number]  MEDICAL RECORD NO.:  [id number]          PATIENT TYPE:  OUT  LOCATION:  SLEEP CENTER                 [facility]ACILITY:  [facility]  PHYSICIAN:  Yoshimi Sarr D. Annamaria Boots, MD, FCCP, FACPDATE OF BIRTH:  [birth year]/14/66  DATE OF STUDY:  10/27/2011                           NOCTURNAL POLYSOMNOGRAM  REFERRING PHYSICIAN:  Jaquita Rector, MD  REFERRING PHYSICIAN:  Jaquita Rector, MD  INDICATION FOR STUDY:  Hypersomnia with sleep apnea.  EPWORTH SLEEPINESS SCORE:  24/24.  BMI 42, weight 310 pounds, height 72 inches, neck 18.5 inches.  HOME MEDICATIONS:  Charted and reviewed.  A baseline diagnostic NPSG on September 13, 2011 recorded AHI 78.9 per hour.  CPAP titration is requested.  SLEEP ARCHITECTURE:  Total sleep time 297 minutes with sleep efficiency 73.2%.  Stage I was 10.4%, stage II 38%, stage III absent.  REM 51.5% of total sleep time.  Sleep latency 21 minutes, REM latency 84 minutes. Awake after sleep onset 88 minutes.  Arousal index 10.5.  BEDTIME MEDICATION:  Lisinopril, HCTZ.  RESPIRATORY DATA:  CPAP titration protocol.  CPAP was titrated to 13 CWP, AHI 2.3 per hour.  He wore a large [employer] FX full face mask with heated humidifier and a C-Flex setting of 3.  OXYGEN DATA:  Snoring was prevented by final CPAP pressures and mean oxygen saturation held 89.6% on room air.  A total of 41.5 minutes was recorded during total recording time, with room air oxygen saturation less than 88%.  On arrival, oxygen saturation was 92% on room air.  Is there underlying cardiopulmonary disease.  CARDIAC DATA:  Sinus rhythm with PVCs and PACs.  MOVEMENT/PARASOMNIA:  No significant movement disturbance.  Bathroom x2.  IMPRESSION/RECOMMENDATION: 1. Successful CPAP titration to 13 CWP, AHI 2.3 per hour.  He wore a     large [employer] FX full face mask with heated humidifier, C-     Flex setting of 3. 2. Baseline diagnostic NPSG on September 13, 2011 had  recorded an AHI     of 78.9 per hour. 3. On the current study, he arrived with room air saturation 92% while     awake.  During the study, he desaturated to a nadir of 69%.  With     CPAP titration, snoring was prevented but mean oxygen saturation on     room air was only 89.6%.  Is there underlying cardiopulmonary     disease?Marland Kitchen  Consider overnight oximetry on room air at home while     wearing CPAP, once he is established. 4. A high percentage of current sleep time was spent in REM.     suggesting the pattern of REM rebound  sometimes noted when normal     sleep architecture is restored by treatment for sleep apnea.     Loyalty Brashier D. Annamaria Boots, MD, [facility], FACP Diplomate, Tax adviser of Sleep Medicine Electronically Signed    CDY/MEDQ  D:  10/31/2011 19:03:48  T:  10/31/2011 19:16:56  Job:  [id number]

## 2011-11-12 ENCOUNTER — Other Ambulatory Visit: Payer: Self-pay | Admitting: Emergency Medicine

## 2011-11-12 DIAGNOSIS — G4733 Obstructive sleep apnea (adult) (pediatric): Secondary | ICD-10-CM

## 2011-11-14 ENCOUNTER — Encounter (HOSPITAL_COMMUNITY): Payer: Self-pay | Admitting: Emergency Medicine

## 2011-11-14 ENCOUNTER — Other Ambulatory Visit: Payer: Self-pay

## 2011-11-14 ENCOUNTER — Emergency Department (HOSPITAL_COMMUNITY)
Admission: EM | Admit: 2011-11-14 | Discharge: 2011-11-14 | Disposition: A | Discharge status: Home / Self Care | Payer: Medicaid Other | Source: Home / Self Care

## 2011-11-14 DIAGNOSIS — K0889 Other specified disorders of teeth and supporting structures: Secondary | ICD-10-CM

## 2011-11-14 DIAGNOSIS — I10 Essential (primary) hypertension: Secondary | ICD-10-CM

## 2011-11-14 DIAGNOSIS — K089 Disorder of teeth and supporting structures, unspecified: Secondary | ICD-10-CM

## 2011-11-14 MED ORDER — HYDROMORPHONE HCL 1 MG/ML IJ SOLN
0.5000 mg | Freq: Once | INTRAMUSCULAR | Status: DC
Start: 2011-11-14 — End: 2011-11-14

## 2011-11-14 MED ORDER — HYDROMORPHONE HCL PF 1 MG/ML IJ SOLN
INTRAMUSCULAR | Status: AC
  Filled 2011-11-14: qty 1

## 2011-11-14 MED ORDER — HYDROMORPHONE HCL 1 MG/ML IJ SOLN
0.5000 mg | Freq: Once | INTRAMUSCULAR | Status: AC
  Administered 2011-11-14: 0.5 mg via INTRAMUSCULAR

## 2011-11-14 MED ORDER — PENICILLIN V POTASSIUM 500 MG PO TABS: 500.0000 mg | ORAL_TABLET | Freq: Three times a day (TID) | ORAL | Status: AC

## 2011-11-14 MED ORDER — HYDROCODONE-ACETAMINOPHEN 10-325 MG PO TABS: 1.0000 | ORAL_TABLET | Freq: Four times a day (QID) | ORAL | Status: AC | PRN

## 2011-11-14 MED ORDER — HYDROMORPHONE HCL 1 MG/ML IJ SOLN: 0.5000 mg | Freq: Once | INTRAMUSCULAR | Status: DC

## 2011-11-14 NOTE — ED Provider Notes (Signed)
Medical screening examination/treatment/procedure(s) were performed by non-physician practitioner and as supervising physician I was immediately available for consultation/collaboration. I reviewed the EKG, there were no changes when compared to EKG from 10/11/2011. Patient is asymptomatic with no signs of hypertensive urgency or crisis. Denies any symptoms at this time suggesting ACS. Precautions given and patient will start new blood pressure medication, or return to care immediately should any concerning symptoms present.  Howell Rucks, MD 11/14/11 2013

## 2011-11-14 NOTE — ED Notes (Signed)
Toothache lower right onset x one week worse today - pt bp elevated

## 2011-11-14 NOTE — ED Notes (Signed)
Pt to follow up with own md - to pick up prescription for metoprolol which was prescribed by primary care physician pt did not know or perhaps understand new medication was at pharmacy for him to start taking for blood pressure - signs and symptoms and reasons to go to ed or call 911  reviewed with pt and family member

## 2011-11-14 NOTE — ED Provider Notes (Signed)
History     CSN: [id number]  Arrival date & time 11/14/11  1319   None     Chief Complaint  Patient presents with  . Dental Pain    (Consider location/radiation/quality/duration/timing/severity/associated sxs/prior treatment) HPI Comments: Pt with hx cerebral aneurysm which per caregiver impacts his memory and cognition.  Apparently pt has had missing filling in R lower tooth for several months, has tried to see dentist but they won't tx him because bp is uncontrolled.  Sees [facility] family medicine resident for care, last seen about a month ago, they are "working on controlling bp".  Takes lisinopril and hctz per caregiver.  Does not take metoprolol; caregiver was unaware of rx for new med by Dr. Darrick Grinder (resident).    Patient is a 46 y.o. male presenting with tooth pain. The history is provided by a caregiver and the patient. The history is limited by the condition of the patient.  Dental PainPrimary symptoms do not include dental injury, headaches or shortness of breath. Primary symptoms comment: toothache Episode onset: several months ago. The symptoms are worsening. The symptoms occur constantly.  Additional symptoms do not include: dental sensitivity to temperature, gum swelling, facial swelling and fatigue. Medical issues include: periodontal disease.    Past Medical History  Diagnosis Date  . HTN (hypertension) 07/27/2011  . DM (diabetes mellitus) 07/27/2011  . Cerebral aneurysm 07/27/2011    Past Surgical History  Procedure Date  . Cerebral aneurysm repair     Family History  Problem Relation Age of Onset  . Coronary artery disease    . Cerebral aneurysm    . Hypertension      History  Substance Use Topics  . Smoking status: Current Everyday Smoker -- 0.2 packs/day  . Smokeless tobacco: Not on file  . Alcohol Use: 7.2 oz/week    12 Cans of beer per week      Review of Systems  Constitutional: Negative for activity change and fatigue.  HENT: Positive for dental  problem. Negative for facial swelling.   Respiratory: Negative for chest tightness and shortness of breath.   Cardiovascular: Negative for chest pain and palpitations.  Neurological: Negative for dizziness, syncope, weakness, light-headedness and headaches.    Allergies  Review of patient's allergies indicates no known allergies.  Home Medications   Current Outpatient Rx  Name Route Sig Dispense Refill  . ACETAMINOPHEN 325 MG PO TABS Oral Take 650 mg by mouth every 6 (six) hours as needed.    Gabriel Earing HEADACHE PO Oral Take by mouth.    Marland Kitchen LISINOPRIL 20 MG PO TABS Oral Take 1 tablet (20 mg total) by mouth daily. 30 tablet 3  . FUROSEMIDE 20 MG PO TABS Oral Take 1 tablet (20 mg total) by mouth daily. 30 tablet 2  . HYDROCODONE-ACETAMINOPHEN 10-325 MG PO TABS Oral Take 1 tablet by mouth every 6 (six) hours as needed for pain. 16 tablet 0  . METOPROLOL TARTRATE 25 MG PO TABS Oral Take 1 tablet (25 mg total) by mouth 2 (two) times daily. 60 tablet 3  . NAPROXEN 500 MG PO TABS Oral Take 1 tablet (500 mg total) by mouth 2 (two) times daily with a meal. 15 tablet 0  . PENICILLIN V POTASSIUM 500 MG PO TABS Oral Take 1 tablet (500 mg total) by mouth 3 (three) times daily. 30 tablet 0    BP 220/138  Pulse 89  Temp(Src) 98.2 F (36.8 C) (Oral)  Resp 22  SpO2 93%  Physical Exam  Constitutional: He appears well-developed and well-nourished. No distress.  HENT:  Mouth/Throat: Oropharynx is clear and moist.    Cardiovascular: Normal rate and regular rhythm.   Pulmonary/Chest: Effort normal and breath sounds normal.  Lymphadenopathy:       Head (right side): No submandibular and no tonsillar adenopathy present.       Head (left side): No submandibular and no tonsillar adenopathy present.    ED Course  Procedures (including critical care time)  Labs Reviewed - No data to display No results found.   1. Pain, dental   2. Hypertension    Discussed pt with Dr. Fulton Mole. Dr. Fulton Mole also  reviewed today's and 10/11/11 ekg with me.  No acute changes.  Pt's pain is likely exacerbating his normally high bp (at last visit to family medicine on 1/14, bp was 160's/100's).  Pt has new rx for metoprolol waiting for him at [facility], caregiver states she will get it filled today and have him start taking.    Reviewed reasons for calling 911 or going to ER including stroke and MI sx.   MDM          Carvel Getting, NP 11/14/11 [phone]

## 2011-11-16 ENCOUNTER — Telehealth: Payer: Self-pay | Admitting: *Deleted

## 2011-11-16 NOTE — Telephone Encounter (Signed)
Spoke with Melissa @ [facility].  They need copy of original sleep study and legible copy of insurance card faxed to their office.  Sleep study from 09/2011 and copy of medicaid card faxed to [phone] (attn:  Dimas Chyle).  Nolene Ebbs, RN

## 2011-11-28 ENCOUNTER — Emergency Department (HOSPITAL_COMMUNITY)
Admission: EM | Admit: 2011-11-28 | Discharge: 2011-11-28 | Disposition: A | Discharge status: Home / Self Care | Payer: No Typology Code available for payment source | Attending: Emergency Medicine | Admitting: Emergency Medicine

## 2011-11-28 ENCOUNTER — Emergency Department (HOSPITAL_COMMUNITY): Payer: No Typology Code available for payment source

## 2011-11-28 ENCOUNTER — Encounter (HOSPITAL_COMMUNITY): Payer: Self-pay | Admitting: *Deleted

## 2011-11-28 DIAGNOSIS — S0180XA Unspecified open wound of other part of head, initial encounter: Secondary | ICD-10-CM | POA: Insufficient documentation

## 2011-11-28 DIAGNOSIS — I10 Essential (primary) hypertension: Secondary | ICD-10-CM | POA: Insufficient documentation

## 2011-11-28 DIAGNOSIS — E119 Type 2 diabetes mellitus without complications: Secondary | ICD-10-CM | POA: Insufficient documentation

## 2011-11-28 DIAGNOSIS — S0181XA Laceration without foreign body of other part of head, initial encounter: Secondary | ICD-10-CM

## 2011-11-28 DIAGNOSIS — S0990XA Unspecified injury of head, initial encounter: Secondary | ICD-10-CM | POA: Insufficient documentation

## 2011-11-28 DIAGNOSIS — Z79899 Other long term (current) drug therapy: Secondary | ICD-10-CM | POA: Insufficient documentation

## 2011-11-28 MED ORDER — ACETAMINOPHEN 325 MG PO TABS
650.0000 mg | ORAL_TABLET | Freq: Once | ORAL | Status: AC
  Administered 2011-11-28: 650 mg via ORAL
  Filled 2011-11-28: qty 2
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
## 2011-11-28 NOTE — ED Notes (Signed)
Pt discharged home. Had no further questions. Instructed to follow up with PCP as needed.
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
## 2011-11-28 NOTE — Progress Notes (Signed)
Chaplain responded to trauma page. Chaplain visited with patient briefly. Patient stated he was coping okay. Chaplain assisted patient with dialing phone numbers to contact family. No follow up needed.
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
## 2011-12-07 ENCOUNTER — Encounter: Payer: Self-pay | Admitting: Cardiovascular Disease

## 2011-12-30 ENCOUNTER — Encounter: Payer: Self-pay | Admitting: Internal Medicine

## 2013-05-09 ENCOUNTER — Telehealth: Payer: Self-pay | Admitting: *Deleted

## 2013-05-09 NOTE — Telephone Encounter (Signed)
Letter sent regarding diabetes follow up care Elizabeth Donley Harland, RN-BSN   

## 2014-01-31 ENCOUNTER — Telehealth: Payer: Self-pay | Admitting: *Deleted

## 2014-01-31 NOTE — Telephone Encounter (Signed)
Left message on voicemail for patient to call back and schedule an appointment for follow up of diabetes/cholesterol.

## 2014-04-10 ENCOUNTER — Ambulatory Visit (INDEPENDENT_AMBULATORY_CARE_PROVIDER_SITE_OTHER): Payer: Self-pay | Admitting: Family Medicine

## 2014-04-10 ENCOUNTER — Encounter: Payer: Self-pay | Admitting: Family Medicine

## 2014-04-10 VITALS — BP 191/131 | HR 87 | Temp 98.1°F | Wt 297.0 lb

## 2014-04-10 DIAGNOSIS — I10 Essential (primary) hypertension: Secondary | ICD-10-CM

## 2014-04-10 DIAGNOSIS — R0602 Shortness of breath: Secondary | ICD-10-CM

## 2014-04-10 LAB — CBC
HCT: 51.2 % (ref 39.0–52.0)
Hemoglobin: 18.2 g/dL — ABNORMAL HIGH (ref 13.0–17.0)
MCH: 29.7 pg (ref 26.0–34.0)
MCHC: 35.5 g/dL (ref 30.0–36.0)
MCV: 83.5 fL (ref 78.0–100.0)
PLATELETS: 238 10*3/uL (ref 150–400)
RBC: 6.13 MIL/uL — ABNORMAL HIGH (ref 4.22–5.81)
RDW: 14.8 % (ref 11.5–15.5)
WBC: 10.7 10*3/uL — ABNORMAL HIGH (ref 4.0–10.5)

## 2014-04-10 LAB — COMPREHENSIVE METABOLIC PANEL
ALBUMIN: 3.3 g/dL — AB (ref 3.5–5.2)
ALT: 42 U/L (ref 0–53)
AST: 25 U/L (ref 0–37)
Alkaline Phosphatase: 126 U/L — ABNORMAL HIGH (ref 39–117)
BUN: 10 mg/dL (ref 6–23)
CALCIUM: 9.4 mg/dL (ref 8.4–10.5)
CHLORIDE: 104 meq/L (ref 96–112)
CO2: 28 mEq/L (ref 19–32)
Creat: 1.36 mg/dL — ABNORMAL HIGH (ref 0.50–1.35)
Glucose, Bld: 87 mg/dL (ref 70–99)
POTASSIUM: 3.8 meq/L (ref 3.5–5.3)
Sodium: 136 mEq/L (ref 135–145)
Total Bilirubin: 0.7 mg/dL (ref 0.2–1.2)
Total Protein: 7.3 g/dL (ref 6.0–8.3)

## 2014-04-10 LAB — TSH: TSH: 1.169 u[IU]/mL (ref 0.350–4.500)

## 2014-04-10 MED ORDER — LISINOPRIL 10 MG PO TABS: 10.0000 mg | ORAL_TABLET | Freq: Every day | ORAL | Status: DC

## 2014-04-10 MED ORDER — LISINOPRIL 20 MG PO TABS: 20.0000 mg | ORAL_TABLET | Freq: Every day | ORAL | Status: DC

## 2014-04-10 MED ORDER — FUROSEMIDE 20 MG PO TABS: 20.0000 mg | ORAL_TABLET | Freq: Every day | ORAL | Status: AC

## 2014-04-10 NOTE — Patient Instructions (Addendum)
It was nice to meet you today!  We are restarting two of your blood pressure medicines today: -lisinopril 10mg  daily -lasix 40mg  daily  I will call you if your test results are not normal.  Otherwise, I will send you a letter.  If you do not hear from me with in 2 weeks please call our office.     FOLLOW UP WITH ME IN ONE WEEK TO RECHECK YOUR BLOOD PRESSURE AND KIDNEY FUNCTION. THIS IS VERY IMPORTANT.  Be well, Dr. Ardelia Mems

## 2014-04-11 LAB — PRO B NATRIURETIC PEPTIDE: Pro B Natriuretic peptide (BNP): 65.61 pg/mL (ref <126)

## 2014-04-12 NOTE — Progress Notes (Signed)
Patient ID: Jake Williams, male   DOB: 05-03-1966, 48 y.o.   MRN: [id number]  HPI:  Pt presents to f/u on HTN. He has not been seen here at the [facility] in about 2.5 years.   HTN - has been out of meds for at least 8 months. Does not check his BP at home. Denies having any chest pain but does get SOB with exertion on a regular basis. Occasionally has swelling in his legs. No syncope. Can tell his BP is high when he gets a headache. Today is a pretty normal day for him, has slight headache but does not feel significantly worse than normal.   ROS: See HPI  [place]: hx of ruptured cerebral aneurysm, OSA but rarely uses CPAP because it is uncomfortable to him  PHYSICAL EXAM: BP 191/131  Pulse 87  Temp(Src) 98.1 F (36.7 C) (Oral)  Wt 297 lb (134.718 kg) Gen: NAD HEENT: NCAT Heart: RRR, no murmurs Lungs: CTAB, NWOB Neuro: grossly nonfocal, speech normal Ext: mild edema bilat lower extremities  ASSESSMENT/PLAN:  See problem based charting for additional assessment/plan.  FOLLOW UP: F/u in 1 week for HTN and shortness of breath (needs repeat BMET at this visit).  [place]. Ardelia Mems, [place]

## 2014-04-12 NOTE — Assessment & Plan Note (Signed)
Endorses dyspnea on exertion on ROS. Echo from 2012 reviewed which showed moderate to severe LVH. Given pt has had untreated HTN for likely the last 2+ years, could have progressed to systolic dysfunction. Will check echo, TSH, CMET, BNP, and CBC. See "HTN" problem for additional plan. F/u in 1 week.

## 2014-04-12 NOTE — Assessment & Plan Note (Addendum)
Uncontrolled. Quite elevated today, but pt not symptomatic (other than mild headache, which is typical for him). Discussed with pt the importance of long term BP control, especially in light of his history of a prior ruptured cerebral aneurysm. Also stressed need for regular follow up. Will start lisinopril 10mg  daily and lasix 20mg  daily. F/u in 1 week for recheck of BP and recheck BMET. See separate problem "shortness of breath" for additional plan.  Precepted with Dr. Andria Frames who agrees with this plan.

## 2014-04-17 ENCOUNTER — Ambulatory Visit (INDEPENDENT_AMBULATORY_CARE_PROVIDER_SITE_OTHER): Payer: Medicaid Other | Admitting: Family Medicine

## 2014-04-17 ENCOUNTER — Encounter: Payer: Self-pay | Admitting: Family Medicine

## 2014-04-17 VITALS — BP 194/146 | HR 76 | Temp 98.2°F | Ht 73.0 in | Wt 298.4 lb

## 2014-04-17 DIAGNOSIS — I10 Essential (primary) hypertension: Secondary | ICD-10-CM

## 2014-04-17 DIAGNOSIS — R0602 Shortness of breath: Secondary | ICD-10-CM

## 2014-04-17 MED ORDER — LISINOPRIL 40 MG PO TABS: 40.0000 mg | ORAL_TABLET | Freq: Every day | ORAL | Status: DC

## 2014-04-17 NOTE — Patient Instructions (Signed)
It was great to see you again today!  For blood pressure: -increasing lisinopril to 40mg  daily -follow up in 1 week to recheck blood pressure and labwork  Your echo (heart ultrasound) appointment is for tomorrow at 10:00am. Also get a chest xray while you are there. I put this order in. The radiology people will tell you where to go.  Be well, Dr. Ardelia Mems

## 2014-04-17 NOTE — Assessment & Plan Note (Signed)
As pt also now endorses chronic cough, will check CXR to go along with his echo tomorrow. F/u in 1 week to review results.

## 2014-04-17 NOTE — Assessment & Plan Note (Signed)
BP remains elevated despite therapy with lisinopril and lasix.  Plan: -increase lisinopril to 40mg  daily -recheck BMET today -f/u in 1 week for repeat BP check and repeat BMET

## 2014-04-17 NOTE — Progress Notes (Signed)
Patient ID: Jake Williams, male   DOB: 1966-03-27, 48 y.o.   MRN: [id number]  HPI:  Pt presents to f/u on HTN. Has been taking lasix 20mg  daily as well as lisinopril 10mg  daily. C/o chronic cough which preceded starting the lisinopril. Does not check BP at home. Denies having any chest pain. Has appt for echo tomorrow due to complaint of chronic SOB. Thinks leg swelling possibly mildly improved.  ROS: See HPI  [place]: hx HTN, cerebral aneurysm, OSA  PHYSICAL EXAM: BP 194/146  Pulse 76  Temp(Src) 98.2 F (36.8 C) (Oral)  Ht 6\' 1"  (1.854 m)  Wt 298 lb 6.4 oz (135.353 kg)  BMI 39.38 kg/m2 Gen: NAD HEENT: NCAT Heart: RRR, no murmurs Lungs: CTAB, NWOB Neuro: grossly nonfocal, speech intact Ext: 2+ edema bilat lower extremities, calves nontender to palpation, no erythema or warmth  ASSESSMENT/PLAN:  See problem based charting for additional assessment/plan.  FOLLOW UP: F/u in 1 week for HTN and dyspnea.  [place]. Ardelia Mems, [place]

## 2014-04-18 ENCOUNTER — Ambulatory Visit (HOSPITAL_COMMUNITY)
Admission: RE | Admit: 2014-04-18 | Discharge: 2014-04-18 | Disposition: A | Discharge status: Home / Self Care | Payer: Medicaid Other | Source: Ambulatory Visit | Attending: Family Medicine | Admitting: Family Medicine

## 2014-04-18 ENCOUNTER — Encounter: Payer: Self-pay | Admitting: Family Medicine

## 2014-04-18 DIAGNOSIS — I517 Cardiomegaly: Secondary | ICD-10-CM

## 2014-04-18 DIAGNOSIS — R0609 Other forms of dyspnea: Secondary | ICD-10-CM | POA: Diagnosis present

## 2014-04-18 DIAGNOSIS — R0989 Other specified symptoms and signs involving the circulatory and respiratory systems: Principal | ICD-10-CM | POA: Insufficient documentation

## 2014-04-18 DIAGNOSIS — R05 Cough: Secondary | ICD-10-CM | POA: Insufficient documentation

## 2014-04-18 DIAGNOSIS — I10 Essential (primary) hypertension: Secondary | ICD-10-CM

## 2014-04-18 DIAGNOSIS — R059 Cough, unspecified: Secondary | ICD-10-CM | POA: Insufficient documentation

## 2014-04-18 DIAGNOSIS — R0602 Shortness of breath: Secondary | ICD-10-CM | POA: Diagnosis present

## 2014-04-18 LAB — BASIC METABOLIC PANEL
BUN: 9 mg/dL (ref 6–23)
CALCIUM: 9.2 mg/dL (ref 8.4–10.5)
CO2: 23 mEq/L (ref 19–32)
CREATININE: 1.25 mg/dL (ref 0.50–1.35)
Chloride: 105 mEq/L (ref 96–112)
Glucose, Bld: 90 mg/dL (ref 70–99)
Potassium: 4.4 mEq/L (ref 3.5–5.3)
Sodium: 135 mEq/L (ref 135–145)

## 2014-04-18 NOTE — Progress Notes (Signed)
Echo Lab  2D Echocardiogram completed.  [place], [place] 04/18/2014 12:03 PM

## 2015-08-19 ENCOUNTER — Inpatient Hospital Stay: Payer: Medicaid Other | Admitting: Family Medicine

## 2015-08-25 ENCOUNTER — Ambulatory Visit (INDEPENDENT_AMBULATORY_CARE_PROVIDER_SITE_OTHER): Payer: Medicaid Other | Admitting: Family Medicine

## 2015-08-25 ENCOUNTER — Encounter: Payer: Self-pay | Admitting: Family Medicine

## 2015-08-25 VITALS — BP 142/88 | HR 63 | Temp 97.8°F | Ht 73.0 in | Wt 283.0 lb

## 2015-08-25 DIAGNOSIS — I10 Essential (primary) hypertension: Secondary | ICD-10-CM

## 2015-08-25 MED ORDER — LISINOPRIL-HYDROCHLOROTHIAZIDE 20-12.5 MG PO TABS: 1.0000 | ORAL_TABLET | Freq: Every day | ORAL | Status: DC

## 2015-08-25 MED ORDER — AMLODIPINE BESYLATE 10 MG PO TABS: 10.0000 mg | ORAL_TABLET | Freq: Every day | ORAL | Status: DC

## 2015-08-25 MED ORDER — METOPROLOL TARTRATE 50 MG PO TABS
50.0000 mg | ORAL_TABLET | Freq: Two times a day (BID) | ORAL | Status: DC
Start: 2015-08-25 — End: 2019-11-16

## 2015-08-25 NOTE — Patient Instructions (Signed)
Hypertension Hypertension, commonly called high blood pressure, is when the force of blood pumping through your arteries is too strong. Your arteries are the blood vessels that carry blood from your heart throughout your body. A blood pressure reading consists of a higher number over a lower number, such as 110/72. The higher number (systolic) is the pressure inside your arteries when your heart pumps. The lower number (diastolic) is the pressure inside your arteries when your heart relaxes. Ideally you want your blood pressure below 120/80. Hypertension forces your heart to work harder to pump blood. Your arteries may become narrow or stiff. Having untreated or uncontrolled hypertension can cause heart attack, stroke, kidney disease, and other problems. RISK FACTORS Some risk factors for high blood pressure are controllable. Others are not.  Risk factors you cannot control include:   Race. You may be at higher risk if you are African American.  Age. Risk increases with age.  Gender. Men are at higher risk than women before age 45 years. After age 65, women are at higher risk than men. Risk factors you can control include:  Not getting enough exercise or physical activity.  Being overweight.  Getting too much fat, sugar, calories, or salt in your diet.  Drinking too much alcohol. SIGNS AND SYMPTOMS Hypertension does not usually cause signs or symptoms. Extremely high blood pressure (hypertensive crisis) may cause headache, anxiety, shortness of breath, and nosebleed. DIAGNOSIS To check if you have hypertension, your health care provider will measure your blood pressure while you are seated, with your arm held at the level of your heart. It should be measured at least twice using the same arm. Certain conditions can cause a difference in blood pressure between your right and left arms. A blood pressure reading that is higher than normal on one occasion does not mean that you need treatment. If  it is not clear whether you have high blood pressure, you may be asked to return on a different day to have your blood pressure checked again. Or, you may be asked to monitor your blood pressure at home for 1 or more weeks. TREATMENT Treating high blood pressure includes making lifestyle changes and possibly taking medicine. Living a healthy lifestyle can help lower high blood pressure. You may need to change some of your habits. Lifestyle changes may include:  Following the DASH diet. This diet is high in fruits, vegetables, and whole grains. It is low in salt, red meat, and added sugars.  Keep your sodium intake below 2,300 mg per day.  Getting at least 30-45 minutes of aerobic exercise at least 4 times per week.  Losing weight if necessary.  Not smoking.  Limiting alcoholic beverages.  Learning ways to reduce stress. Your health care provider may prescribe medicine if lifestyle changes are not enough to get your blood pressure under control, and if one of the following is true:  You are 18-59 years of age and your systolic blood pressure is above 140.  You are 60 years of age or older, and your systolic blood pressure is above 150.  Your diastolic blood pressure is above 90.  You have diabetes, and your systolic blood pressure is over 140 or your diastolic blood pressure is over 90.  You have kidney disease and your blood pressure is above 140/90.  You have heart disease and your blood pressure is above 140/90. Your personal target blood pressure may vary depending on your medical conditions, your age, and other factors. HOME CARE INSTRUCTIONS    Have your blood pressure rechecked as directed by your health care provider.   Take medicines only as directed by your health care provider. Follow the directions carefully. Blood pressure medicines must be taken as prescribed. The medicine does not work as well when you skip doses. Skipping doses also puts you at risk for  problems.  Do not smoke.   Monitor your blood pressure at home as directed by your health care provider. SEEK MEDICAL CARE IF:   You think you are having a reaction to medicines taken.  You have recurrent headaches or feel dizzy.  You have swelling in your ankles.  You have trouble with your vision. SEEK IMMEDIATE MEDICAL CARE IF:  You develop a severe headache or confusion.  You have unusual weakness, numbness, or feel faint.  You have severe chest or abdominal pain.  You vomit repeatedly.  You have trouble breathing. MAKE SURE YOU:   Understand these instructions.  Will watch your condition.  Will get help right away if you are not doing well or get worse.   This information is not intended to replace advice given to you by your health care provider. Make sure you discuss any questions you have with your health care provider.   Document Released: 09/13/2005 Document Revised: 01/28/2015 Document Reviewed: 07/06/2013 Elsevier Interactive Patient Education 2016 Elsevier Inc.  

## 2015-08-25 NOTE — Progress Notes (Signed)
   Subjective:   Jake Williams is a 49 y.o. male with a history of htn, ED, DM, OSA presenting to re-establish care in the setting of recent hospitalization at [facility] for Hypertensive Urgency. He has stopped hi BP medications for several months and presented there with CP and blood pressures of 200s/100 per patient.  They lowered BP and restarted medications. He his here to continue these medications.   Here for  Chief Complaint  Patient presents with  . Hypertension  Denies CP, SOB, swelling in legs. Reports he is taking HCTZ, lisinopril, labetalol, and amlodipine.   Health Maintenance Due  Topic Date Due  . PNEUMOCOCCAL POLYSACCHARIDE VACCINE (1) 08/02/1969  . FOOT EXAM  08/02/1977  . OPHTHALMOLOGY EXAM  08/02/1977  . URINE MICROALBUMIN  08/02/1977  . HIV Screening  08/02/1982  . TETANUS/TDAP  08/02/1986  . HEMOGLOBIN A1C  01/25/2012  . INFLUENZA VACCINE  04/28/2015   Review of Systems:   Per HPI. All other systems reviewed and are negative expect as in HPI   PMH, PSH, Medications, Allergies, SocialHx and FHx reviewed and updated in EMR- marked as reviewed 08/25/2015   Objective:  BP 142/88 mmHg  Pulse 63  Temp(Src) 97.8 F (36.6 C) (Oral)  Ht 6\' 1"  (1.854 m)  Wt 283 lb (128.368 kg)  BMI 37.35 kg/m2  Gen:  49 y.o. male in NAD. Speaking in full sentences. Good eye contact HEENT: NCAT, MMM, anicteric sclerae.  CV: RRR, no MRG, no JVD Resp: Normal WOB. Non-labored, CTAB, no wheezes noted GI: Soft, NTND, BS present, no guarding or organomegaly Ext: WWP, no edema MSK: Intact gait. Full ROM Neuro: Alert and oriented, speech normal Pysch: Normal mood and affect.       Chemistry      Component Value Date/Time   NA 135 04/17/2014 1604   K 4.4 04/17/2014 1604   CL 105 04/17/2014 1604   CO2 23 04/17/2014 1604   BUN 9 04/17/2014 1604   CREATININE 1.25 04/17/2014 1604   CREATININE 1.44* 07/29/2011 0640      Component Value Date/Time   CALCIUM 9.2 04/17/2014 1604     ALKPHOS 126* 04/10/2014 1701   AST 25 04/10/2014 1701   ALT 42 04/10/2014 1701   BILITOT 0.7 04/10/2014 1701      Lab Results  Component Value Date   HGBA1C 5.9* 07/28/2011   Assessment:     Jake Williams is a 49 y.o. male here for HTN   Plan:   #Hypertension - Will combine HCTZ/Lisinopril for combo pill  -change labetalol to metoprolol -continue amlodipine - rtc in 3 months for chronic disease follow up  #Erectrile dysfunction -reviewed pathophysiology -discussed that HTN is more likely to cause ED than medications given small vessel damage  #Tobacco use -pre-contemplative regarding quitting -discussed increased risk of CVD, ED, stroke, etc with smoking and offered resources. Patient would like to continue to think about quitting.   #HCM - needs multiple issues addressed. Recommended PCP visit.   Caren Macadam, MD, AB[facility] OB Fellow, [facility] 08/25/2015  4:19 PM

## 2019-11-11 ENCOUNTER — Inpatient Hospital Stay (HOSPITAL_COMMUNITY)
Admission: EM | Admit: 2019-11-11 | Discharge: 2019-11-16 | DRG: 177 | Disposition: A | Discharge status: Home / Self Care | Payer: Medicare HMO | Attending: Internal Medicine | Admitting: Internal Medicine

## 2019-11-11 ENCOUNTER — Encounter (HOSPITAL_COMMUNITY): Payer: Self-pay | Admitting: Emergency Medicine

## 2019-11-11 ENCOUNTER — Emergency Department (HOSPITAL_COMMUNITY): Payer: Medicare HMO

## 2019-11-11 ENCOUNTER — Other Ambulatory Visit: Payer: Self-pay

## 2019-11-11 DIAGNOSIS — F17210 Nicotine dependence, cigarettes, uncomplicated: Secondary | ICD-10-CM | POA: Diagnosis present

## 2019-11-11 DIAGNOSIS — Z79899 Other long term (current) drug therapy: Secondary | ICD-10-CM

## 2019-11-11 DIAGNOSIS — Z8249 Family history of ischemic heart disease and other diseases of the circulatory system: Secondary | ICD-10-CM

## 2019-11-11 DIAGNOSIS — J1282 Pneumonia due to coronavirus disease 2019: Secondary | ICD-10-CM

## 2019-11-11 DIAGNOSIS — J440 Chronic obstructive pulmonary disease with acute lower respiratory infection: Secondary | ICD-10-CM | POA: Diagnosis present

## 2019-11-11 DIAGNOSIS — U071 COVID-19: Principal | ICD-10-CM

## 2019-11-11 DIAGNOSIS — E1165 Type 2 diabetes mellitus with hyperglycemia: Secondary | ICD-10-CM | POA: Diagnosis not present

## 2019-11-11 DIAGNOSIS — Z9119 Patient's noncompliance with other medical treatment and regimen: Secondary | ICD-10-CM

## 2019-11-11 DIAGNOSIS — J9601 Acute respiratory failure with hypoxia: Secondary | ICD-10-CM

## 2019-11-11 DIAGNOSIS — E119 Type 2 diabetes mellitus without complications: Secondary | ICD-10-CM

## 2019-11-11 DIAGNOSIS — E8779 Other fluid overload: Secondary | ICD-10-CM | POA: Diagnosis present

## 2019-11-11 DIAGNOSIS — F1410 Cocaine abuse, uncomplicated: Secondary | ICD-10-CM | POA: Diagnosis present

## 2019-11-11 DIAGNOSIS — I10 Essential (primary) hypertension: Secondary | ICD-10-CM

## 2019-11-11 DIAGNOSIS — Z91199 Patient's noncompliance with other medical treatment and regimen due to unspecified reason: Secondary | ICD-10-CM

## 2019-11-11 DIAGNOSIS — Z8679 Personal history of other diseases of the circulatory system: Secondary | ICD-10-CM

## 2019-11-11 DIAGNOSIS — Z992 Dependence on renal dialysis: Secondary | ICD-10-CM

## 2019-11-11 DIAGNOSIS — E1122 Type 2 diabetes mellitus with diabetic chronic kidney disease: Secondary | ICD-10-CM | POA: Diagnosis present

## 2019-11-11 DIAGNOSIS — R0600 Dyspnea, unspecified: Secondary | ICD-10-CM | POA: Diagnosis present

## 2019-11-11 DIAGNOSIS — Z9981 Dependence on supplemental oxygen: Secondary | ICD-10-CM

## 2019-11-11 DIAGNOSIS — E872 Acidosis: Secondary | ICD-10-CM | POA: Diagnosis present

## 2019-11-11 DIAGNOSIS — Z9114 Patient's other noncompliance with medication regimen: Secondary | ICD-10-CM

## 2019-11-11 DIAGNOSIS — E8889 Other specified metabolic disorders: Secondary | ICD-10-CM | POA: Diagnosis present

## 2019-11-11 DIAGNOSIS — E8729 Other acidosis: Secondary | ICD-10-CM

## 2019-11-11 DIAGNOSIS — B181 Chronic viral hepatitis B without delta-agent: Secondary | ICD-10-CM | POA: Diagnosis present

## 2019-11-11 DIAGNOSIS — Z9115 Patient's noncompliance with renal dialysis: Secondary | ICD-10-CM

## 2019-11-11 DIAGNOSIS — J9621 Acute and chronic respiratory failure with hypoxia: Secondary | ICD-10-CM | POA: Diagnosis present

## 2019-11-11 DIAGNOSIS — I120 Hypertensive chronic kidney disease with stage 5 chronic kidney disease or end stage renal disease: Secondary | ICD-10-CM | POA: Diagnosis present

## 2019-11-11 DIAGNOSIS — R0602 Shortness of breath: Secondary | ICD-10-CM | POA: Diagnosis not present

## 2019-11-11 DIAGNOSIS — N186 End stage renal disease: Secondary | ICD-10-CM | POA: Diagnosis present

## 2019-11-11 DIAGNOSIS — J9602 Acute respiratory failure with hypercapnia: Secondary | ICD-10-CM

## 2019-11-11 DIAGNOSIS — Y92230 Patient room in hospital as the place of occurrence of the external cause: Secondary | ICD-10-CM | POA: Diagnosis not present

## 2019-11-11 DIAGNOSIS — D631 Anemia in chronic kidney disease: Secondary | ICD-10-CM | POA: Diagnosis present

## 2019-11-11 DIAGNOSIS — T380X5A Adverse effect of glucocorticoids and synthetic analogues, initial encounter: Secondary | ICD-10-CM | POA: Diagnosis not present

## 2019-11-11 LAB — CBG MONITORING, ED: Glucose-Capillary: 67 mg/dL — ABNORMAL LOW (ref 70–99)

## 2019-11-11 MED ORDER — HYDRALAZINE HCL 25 MG PO TABS
50.0000 mg | ORAL_TABLET | Freq: Once | ORAL | Status: AC
  Administered 2019-11-11: 50 mg via ORAL
  Filled 2019-11-11: qty 2

## 2019-11-11 MED ORDER — AMLODIPINE BESYLATE 5 MG PO TABS
10.0000 mg | ORAL_TABLET | Freq: Once | ORAL | Status: AC
  Administered 2019-11-11: 10 mg via ORAL
  Filled 2019-11-11: qty 2

## 2019-11-11 MED ORDER — ISOSORBIDE MONONITRATE ER 30 MG PO TB24
120.0000 mg | ORAL_TABLET | Freq: Once | ORAL | Status: AC
  Administered 2019-11-11: 120 mg via ORAL
  Filled 2019-11-11: qty 4

## 2019-11-11 NOTE — ED Triage Notes (Signed)
Pt. BIB [facility] from home c/o SOB, worsening leg swelling, and distended abdomen.   Dialysis Tuesday, Thursday, and Saturday. Last dialysis Saturday. Partial session d/t weather problems at facility.   Pt states being noncompliant with BP/lasix medication.   Pt. 96% on home O2 at 2 L.

## 2019-11-11 NOTE — ED Provider Notes (Addendum)
TIME SEEN: 11:10 PM  CHIEF COMPLAINT: Shortness of breath  HPI: Patient is a 54 year old male with history of hypertension, diabetes, end-stage renal disease on hemodialysis Tuesday, Thursday and Saturday who presents to the emergency department with complaints of shortness of breath and peripheral edema.  States he did not get a full dialysis session on Saturday due to "pipes being frozen".  He wears oxygen at baseline, 2 L, due to COPD.  Continues to smoke.  Has documented history of medical noncompliance.  He is extremely hypertensive here.  States this is normal for him.  It appears he is on amlodipine, labetalol, hydralazine and Imdur.  He is unable to tell me when he last took his medications but states he did not take them today.  He also has a documented history of cocaine abuse.  Denies chest pain, chest discomfort, headache, fever.  Has had dry cough.  Has been on dialysis for approximately 1 year per his report.  Still makes urine twice a day.  ROS: See HPI Constitutional: no fever  Eyes: no drainage  ENT: no runny nose   Cardiovascular:  no chest pain  Resp:  SOB  GI: no vomiting GU: no dysuria Integumentary: no rash  Allergy: no hives  Musculoskeletal: no leg swelling  Neurological: no slurred speech ROS otherwise negative  PAST MEDICAL HISTORY/PAST SURGICAL HISTORY:  Past Medical History:  Diagnosis Date  . Cerebral aneurysm 07/27/2011  . DM (diabetes mellitus) ([place]) 07/27/2011  . HTN (hypertension) 07/27/2011    MEDICATIONS:  Prior to Admission medications   Medication Sig Start Date End Date Taking? Authorizing Provider  acetaminophen (TYLENOL) 325 MG tablet Take 650 mg by mouth every 6 (six) hours as needed.    [provider]  amLODipine (NORVASC) 10 MG tablet Take 1 tablet (10 mg total) by mouth daily. 08/25/15   Caren Macadam, MD  furosemide (LASIX) 20 MG tablet Take 1 tablet (20 mg total) by mouth daily. 04/10/14 04/10/15  Leeanne Rio, MD   lisinopril (PRINIVIL,ZESTRIL) 40 MG tablet Take 1 tablet (40 mg total) by mouth daily. 04/17/14   Leeanne Rio, MD  lisinopril-hydrochlorothiazide (ZESTORETIC) 20-12.5 MG tablet Take 1 tablet by mouth daily. 08/25/15   Caren Macadam, MD  metoprolol (LOPRESSOR) 50 MG tablet Take 1 tablet (50 mg total) by mouth 2 (two) times daily. 08/25/15   Caren Macadam, MD    ALLERGIES:  No Known Allergies  SOCIAL HISTORY:  Social History   Tobacco Use  . Smoking status: Current Every Day Smoker    Packs/day: 0.25  Substance Use Topics  . Alcohol use: Yes    Alcohol/week: 12.0 standard drinks    Types: 12 Cans of beer per week    FAMILY HISTORY: Family History  Problem Relation Age of Onset  . Coronary artery disease Father   . Heart disease Father   . Cerebral aneurysm Sister   . Hypertension Sister   . Colon cancer Neg Hx   . Prostate cancer Neg Hx     EXAM: BP (!) 180/119   Pulse 82   Temp 97.7 F (36.5 C) (Oral)   Resp 17   Ht 6\' 1"  (1.854 m)   Wt 128.4 kg   SpO2 93%   BMI 37.35 kg/m  CONSTITUTIONAL: Alert and oriented and responds appropriately to questions.  Chronically ill-appearing. HEAD: Normocephalic EYES: Conjunctivae clear, pupils appear equal, EOM appear intact ENT: normal nose; moist mucous membranes NECK: Supple, normal ROM CARD: RRR; S1 and  S2 appreciated; no murmurs, no clicks, no rubs, no gallops CHEST: Dialysis catheter noted to the right chest wall without surrounding redness, warmth, drainage or bleeding.  Area is nontender to palpation. RESP: Patient is mildly tachypneic.  Speaking short sentences.  Crackles at bilateral bases.  Diminished aeration.  No rhonchi or wheezing.  Sats in the upper 80s on his 2 L nasal cannula. ABD/GI: Normal bowel sounds; non-distended; soft, non-tender, no rebound, no guarding, no peritoneal signs, no hepatosplenomegaly BACK:  The back appears normal EXT: Normal ROM in all joints; no deformity noted,  peripheral edema to the mid thigh bilaterally; no cyanosis SKIN: Normal color for age and race; warm; no rash on exposed skin NEURO: Moves all extremities equally PSYCH: The patient's mood and manner are appropriate.   MEDICAL DECISION MAKING: Patient here with signs of volume overload.  Has history of medical noncompliance reports he did not have a full dialysis session on Saturday.  Scheduled for dialysis next on Tuesday.  He has increased oxygen requirement here and appears short of breath with tachypnea and speaking short sentences but is not in distress.  Anticipate patient will need admission to the hospital.  Will give him his home Imdur, amlodipine and hydralazine here in the ED.  Given he has a history of cocaine abuse, will hold his home labetalol at this time until we have a urine drug screen.  Will obtain labs, chest x-ray, Covid swab.  EKG shows no new ischemic abnormality or signs of hyperkalemia.  ED PROGRESS: Patient's gas shows respiratory acidosis.  Chest x-ray read as possible pneumonia but I suspect this is edema.  Patient denies productive cough.  Has no fever or leukocytosis.  He appears volume overloaded on exam.  I recommended BiPAP but patient has refused several times.  He is slightly somnolent but arousable to voice.  Initial blood glucose was 67 and after p.o. intake is now in the 80s.  He denies headache has no focal neurologic deficits.  Again no chest pain or chest discomfort.  Have recommended admission.  I feel he will need dialysis urgently.  I do not feel diuretics will be helpful given he does not make urine frequently.  Blood pressure improving slightly with oral medications and closer to his baseline.  Will give IV hydralazine as needed.  I do not currently feel he needs an infusion given it appears his blood pressures are chronically elevated based on previous ED and PCP notes.   1:19 AM Discussed patient's case with hospitalist, Dr. Maudie Mercury.  I have recommended admission  and patient (and family if present) agree with this plan. Admitting physician will place admission orders.   I reviewed all nursing notes, vitals, pertinent previous records and interpreted all EKGs, lab and urine results, imaging (as available).  2:25 AM  Pt's Covid test has come back positive.  This could be the cause of his opacity seen on chest x-ray.  He will still need admission to [facility] given he will need dialysis.  Placed on airborne and contact precautions.  Hospitalist paged with update.   EKG Interpretation  Date/Time:  Sunday November 11 2019 22:58:53 EST Ventricular Rate:  80 PR Interval:    QRS Duration: 104 QT Interval:  413 QTC Calculation: 477 R Axis:   -54 Text Interpretation: Sinus rhythm LAD, consider left anterior fascicular block Borderline prolonged QT interval Baseline wander in lead(s) V5 V6 Confirmed by Pryor Curia [phone]) on 11/11/2019 11:04:26 PM       EKG  Interpretation  Date/Time:  Sunday November 11 2019 23:41:38 EST Ventricular Rate:  81 PR Interval:    QRS Duration: 107 QT Interval:  411 QTC Calculation: 478 R Axis:   -61 Text Interpretation: Sinus rhythm LAD, consider left anterior fascicular block Borderline prolonged QT interval No significant change since last tracing Confirmed by Pryor Curia [phone]) on 11/11/2019 11:45:02 PM       CRITICAL CARE Performed by: Jake Williams   Total critical care time: 45 minutes  Critical care time was exclusive of separately billable procedures and treating other patients.  Critical care was necessary to treat or prevent imminent or life-threatening deterioration.  Critical care was time spent personally by me on the following activities: development of treatment plan with patient and/or surrogate as well as nursing, discussions with consultants, evaluation of patient's response to treatment, examination of patient, obtaining history from patient or surrogate, ordering and performing treatments and  interventions, ordering and review of laboratory studies, ordering and review of radiographic studies, pulse oximetry and re-evaluation of patient's condition.   Jake Williams was evaluated in Emergency Department on 11/11/2019 for the symptoms described in the history of present illness. He was evaluated in the context of the global COVID-19 pandemic, which necessitated consideration that the patient might be at risk for infection with the SARS-CoV-2 virus that causes COVID-19. Institutional protocols and algorithms that pertain to the evaluation of patients at risk for COVID-19 are in a state of rapid change based on information released by regulatory bodies including the CDC and federal and state organizations. These policies and algorithms were followed during the patient's care in the ED.  Patient was seen wearing N95, face shield, gloves.    Jake Williams, Jake Bison, DO 11/12/19 0121    Jake Williams, Jake Bison, DO 11/12/19 BP:[id number]

## 2019-11-12 ENCOUNTER — Encounter (HOSPITAL_COMMUNITY): Payer: Self-pay | Admitting: Internal Medicine

## 2019-11-12 DIAGNOSIS — E1165 Type 2 diabetes mellitus with hyperglycemia: Secondary | ICD-10-CM | POA: Diagnosis not present

## 2019-11-12 DIAGNOSIS — Z79899 Other long term (current) drug therapy: Secondary | ICD-10-CM | POA: Diagnosis not present

## 2019-11-12 DIAGNOSIS — N186 End stage renal disease: Secondary | ICD-10-CM | POA: Diagnosis not present

## 2019-11-12 DIAGNOSIS — U071 COVID-19: Secondary | ICD-10-CM | POA: Diagnosis present

## 2019-11-12 DIAGNOSIS — E8889 Other specified metabolic disorders: Secondary | ICD-10-CM | POA: Diagnosis present

## 2019-11-12 DIAGNOSIS — Z8249 Family history of ischemic heart disease and other diseases of the circulatory system: Secondary | ICD-10-CM | POA: Diagnosis not present

## 2019-11-12 DIAGNOSIS — Z992 Dependence on renal dialysis: Secondary | ICD-10-CM | POA: Diagnosis not present

## 2019-11-12 DIAGNOSIS — Z9114 Patient's other noncompliance with medication regimen: Secondary | ICD-10-CM | POA: Diagnosis not present

## 2019-11-12 DIAGNOSIS — J440 Chronic obstructive pulmonary disease with acute lower respiratory infection: Secondary | ICD-10-CM | POA: Diagnosis present

## 2019-11-12 DIAGNOSIS — F1410 Cocaine abuse, uncomplicated: Secondary | ICD-10-CM | POA: Diagnosis present

## 2019-11-12 DIAGNOSIS — R0600 Dyspnea, unspecified: Secondary | ICD-10-CM | POA: Diagnosis not present

## 2019-11-12 DIAGNOSIS — I120 Hypertensive chronic kidney disease with stage 5 chronic kidney disease or end stage renal disease: Secondary | ICD-10-CM | POA: Diagnosis present

## 2019-11-12 DIAGNOSIS — Z9981 Dependence on supplemental oxygen: Secondary | ICD-10-CM | POA: Diagnosis not present

## 2019-11-12 DIAGNOSIS — D631 Anemia in chronic kidney disease: Secondary | ICD-10-CM | POA: Diagnosis present

## 2019-11-12 DIAGNOSIS — E8779 Other fluid overload: Secondary | ICD-10-CM | POA: Diagnosis present

## 2019-11-12 DIAGNOSIS — Z8679 Personal history of other diseases of the circulatory system: Secondary | ICD-10-CM | POA: Diagnosis not present

## 2019-11-12 DIAGNOSIS — B181 Chronic viral hepatitis B without delta-agent: Secondary | ICD-10-CM | POA: Diagnosis present

## 2019-11-12 DIAGNOSIS — Z9119 Patient's noncompliance with other medical treatment and regimen: Secondary | ICD-10-CM | POA: Diagnosis not present

## 2019-11-12 DIAGNOSIS — Z9115 Patient's noncompliance with renal dialysis: Secondary | ICD-10-CM | POA: Diagnosis not present

## 2019-11-12 DIAGNOSIS — Y92230 Patient room in hospital as the place of occurrence of the external cause: Secondary | ICD-10-CM | POA: Diagnosis not present

## 2019-11-12 DIAGNOSIS — R0602 Shortness of breath: Secondary | ICD-10-CM | POA: Diagnosis present

## 2019-11-12 DIAGNOSIS — J9601 Acute respiratory failure with hypoxia: Secondary | ICD-10-CM | POA: Diagnosis not present

## 2019-11-12 DIAGNOSIS — E1122 Type 2 diabetes mellitus with diabetic chronic kidney disease: Secondary | ICD-10-CM | POA: Diagnosis present

## 2019-11-12 DIAGNOSIS — F17210 Nicotine dependence, cigarettes, uncomplicated: Secondary | ICD-10-CM | POA: Diagnosis present

## 2019-11-12 DIAGNOSIS — E872 Acidosis: Secondary | ICD-10-CM | POA: Diagnosis present

## 2019-11-12 DIAGNOSIS — J9621 Acute and chronic respiratory failure with hypoxia: Secondary | ICD-10-CM | POA: Diagnosis present

## 2019-11-12 DIAGNOSIS — J1282 Pneumonia due to coronavirus disease 2019: Secondary | ICD-10-CM | POA: Diagnosis present

## 2019-11-12 DIAGNOSIS — T380X5A Adverse effect of glucocorticoids and synthetic analogues, initial encounter: Secondary | ICD-10-CM | POA: Diagnosis not present

## 2019-11-12 LAB — CBC WITH DIFFERENTIAL/PLATELET
Abs Immature Granulocytes: 0.03 10*3/uL (ref 0.00–0.07)
Basophils Absolute: 0 10*3/uL (ref 0.0–0.1)
Basophils Relative: 0 %
Eosinophils Absolute: 0.1 10*3/uL (ref 0.0–0.5)
Eosinophils Relative: 2 %
HCT: 36.8 % — ABNORMAL LOW (ref 39.0–52.0)
Hemoglobin: 10.9 g/dL — ABNORMAL LOW (ref 13.0–17.0)
Immature Granulocytes: 0 %
Lymphocytes Relative: 12 %
Lymphs Abs: 0.9 10*3/uL (ref 0.7–4.0)
MCH: 30.4 pg (ref 26.0–34.0)
MCHC: 29.6 g/dL — ABNORMAL LOW (ref 30.0–36.0)
MCV: 102.5 fL — ABNORMAL HIGH (ref 80.0–100.0)
Monocytes Absolute: 0.6 10*3/uL (ref 0.1–1.0)
Monocytes Relative: 8 %
Neutro Abs: 5.9 10*3/uL (ref 1.7–7.7)
Neutrophils Relative %: 78 %
Platelets: 177 10*3/uL (ref 150–400)
RBC: 3.59 MIL/uL — ABNORMAL LOW (ref 4.22–5.81)
RDW: 18.3 % — ABNORMAL HIGH (ref 11.5–15.5)
WBC: 7.6 10*3/uL (ref 4.0–10.5)
nRBC: 0 % (ref 0.0–0.2)

## 2019-11-12 LAB — GLUCOSE, CAPILLARY
Glucose-Capillary: 106 mg/dL — ABNORMAL HIGH (ref 70–99)
Glucose-Capillary: 129 mg/dL — ABNORMAL HIGH (ref 70–99)
Glucose-Capillary: 127 mg/dL — ABNORMAL HIGH (ref 70–99)

## 2019-11-12 LAB — CBC
HCT: 37 % — ABNORMAL LOW (ref 39.0–52.0)
Hemoglobin: 11 g/dL — ABNORMAL LOW (ref 13.0–17.0)
MCH: 30 pg (ref 26.0–34.0)
MCHC: 29.7 g/dL — ABNORMAL LOW (ref 30.0–36.0)
MCV: 100.8 fL — ABNORMAL HIGH (ref 80.0–100.0)
Platelets: 190 10*3/uL (ref 150–400)
RBC: 3.67 MIL/uL — ABNORMAL LOW (ref 4.22–5.81)
RDW: 18.4 % — ABNORMAL HIGH (ref 11.5–15.5)
WBC: 7.9 10*3/uL (ref 4.0–10.5)
nRBC: 0 % (ref 0.0–0.2)

## 2019-11-12 LAB — COMPREHENSIVE METABOLIC PANEL
ALT: 20 U/L (ref 0–44)
ALT: 20 U/L (ref 0–44)
AST: 14 U/L — ABNORMAL LOW (ref 15–41)
AST: 14 U/L — ABNORMAL LOW (ref 15–41)
Albumin: 3.3 g/dL — ABNORMAL LOW (ref 3.5–5.0)
Albumin: 3.5 g/dL (ref 3.5–5.0)
Alkaline Phosphatase: 52 U/L (ref 38–126)
Alkaline Phosphatase: 60 U/L (ref 38–126)
Anion gap: 12 (ref 5–15)
Anion gap: 13 (ref 5–15)
BUN: 59 mg/dL — ABNORMAL HIGH (ref 6–20)
BUN: 60 mg/dL — ABNORMAL HIGH (ref 6–20)
CO2: 21 mmol/L — ABNORMAL LOW (ref 22–32)
CO2: 23 mmol/L (ref 22–32)
Calcium: 8.6 mg/dL — ABNORMAL LOW (ref 8.9–10.3)
Calcium: 8.6 mg/dL — ABNORMAL LOW (ref 8.9–10.3)
Chloride: 104 mmol/L (ref 98–111)
Chloride: 106 mmol/L (ref 98–111)
Creatinine, Ser: 13.34 mg/dL — ABNORMAL HIGH (ref 0.61–1.24)
Creatinine, Ser: 13.65 mg/dL — ABNORMAL HIGH (ref 0.61–1.24)
GFR calc Af Amer: 4 mL/min — ABNORMAL LOW (ref >60)
GFR calc Af Amer: 4 mL/min — ABNORMAL LOW (ref >60)
GFR calc non Af Amer: 4 mL/min — ABNORMAL LOW (ref >60)
GFR calc non Af Amer: 4 mL/min — ABNORMAL LOW (ref >60)
Glucose, Bld: 85 mg/dL (ref 70–99)
Glucose, Bld: 95 mg/dL (ref 70–99)
Potassium: 4.4 mmol/L (ref 3.5–5.1)
Potassium: 4.5 mmol/L (ref 3.5–5.1)
Sodium: 139 mmol/L (ref 135–145)
Sodium: 140 mmol/L (ref 135–145)
Total Bilirubin: 0.5 mg/dL (ref 0.3–1.2)
Total Bilirubin: 0.5 mg/dL (ref 0.3–1.2)
Total Protein: 6.7 g/dL (ref 6.5–8.1)
Total Protein: 6.9 g/dL (ref 6.5–8.1)

## 2019-11-12 LAB — FERRITIN: Ferritin: 136 ng/mL (ref 24–336)

## 2019-11-12 LAB — RESPIRATORY PANEL BY RT PCR (FLU A&B, COVID)
Influenza A by PCR: NEGATIVE
Influenza B by PCR: NEGATIVE
SARS Coronavirus 2 by RT PCR: POSITIVE — AB

## 2019-11-12 LAB — POCT I-STAT 7, (LYTES, BLD GAS, ICA,H+H)
Acid-base deficit: 3 mmol/L — ABNORMAL HIGH (ref 0.0–2.0)
Bicarbonate: 24.2 mmol/L (ref 20.0–28.0)
Calcium, Ion: 1.23 mmol/L (ref 1.15–1.40)
HCT: 32 % — ABNORMAL LOW (ref 39.0–52.0)
Hemoglobin: 10.9 g/dL — ABNORMAL LOW (ref 13.0–17.0)
O2 Saturation: 95 %
Patient temperature: 97.7
Potassium: 4.3 mmol/L (ref 3.5–5.1)
Sodium: 139 mmol/L (ref 135–145)
TCO2: 26 mmol/L (ref 22–32)
pCO2 arterial: 49.6 mmHg — ABNORMAL HIGH (ref 32.0–48.0)
pH, Arterial: 7.293 — ABNORMAL LOW (ref 7.350–7.450)
pO2, Arterial: 81 mmHg — ABNORMAL LOW (ref 83.0–108.0)

## 2019-11-12 LAB — LACTATE DEHYDROGENASE: LDH: 177 U/L (ref 98–192)

## 2019-11-12 LAB — C-REACTIVE PROTEIN: CRP: 1.8 mg/dL — ABNORMAL HIGH (ref <1.0)

## 2019-11-12 LAB — HIV ANTIBODY (ROUTINE TESTING W REFLEX): HIV Screen 4th Generation wRfx: NONREACTIVE

## 2019-11-12 LAB — CBG MONITORING, ED
Glucose-Capillary: 165 mg/dL — ABNORMAL HIGH (ref 70–99)
Glucose-Capillary: 80 mg/dL (ref 70–99)

## 2019-11-12 LAB — D-DIMER, QUANTITATIVE: D-Dimer, Quant: 2.3 ug/mL-FEU — ABNORMAL HIGH (ref 0.00–0.50)

## 2019-11-12 LAB — ABO/RH: ABO/RH(D): B POS

## 2019-11-12 LAB — HEMOGLOBIN A1C
Hgb A1c MFr Bld: 4.9 % (ref 4.8–5.6)
Mean Plasma Glucose: 93.93 mg/dL

## 2019-11-12 LAB — FIBRINOGEN: Fibrinogen: 418 mg/dL (ref 210–475)

## 2019-11-12 LAB — BRAIN NATRIURETIC PEPTIDE: B Natriuretic Peptide: 839.5 pg/mL — ABNORMAL HIGH (ref 0.0–100.0)

## 2019-11-12 LAB — PROCALCITONIN: Procalcitonin: 0.85 ng/mL

## 2019-11-12 LAB — TROPONIN I (HIGH SENSITIVITY)
Troponin I (High Sensitivity): 18 ng/L — ABNORMAL HIGH (ref <18)
Troponin I (High Sensitivity): 19 ng/L — ABNORMAL HIGH (ref <18)

## 2019-11-12 MED ORDER — HYDRALAZINE HCL 20 MG/ML IJ SOLN
10.0000 mg | Freq: Four times a day (QID) | INTRAMUSCULAR | Status: DC | PRN
  Administered 2019-11-12 – 2019-11-13 (×2): 10 mg via INTRAVENOUS
  Filled 2019-11-12 (×2): qty 1

## 2019-11-12 MED ORDER — HEPARIN SODIUM (PORCINE) 5000 UNIT/ML IJ SOLN: 5000.0000 [IU] | Freq: Three times a day (TID) | INTRAMUSCULAR | Status: DC

## 2019-11-12 MED ORDER — SODIUM CHLORIDE 0.9 % IV SOLN
2.0000 g | Freq: Every day | INTRAVENOUS | Status: DC
  Administered 2019-11-12: 2 g via INTRAVENOUS
  Filled 2019-11-12: qty 20

## 2019-11-12 MED ORDER — LISINOPRIL 40 MG PO TABS
40.0000 mg | ORAL_TABLET | Freq: Every day | ORAL | Status: DC
  Administered 2019-11-12 – 2019-11-16 (×4): 40 mg via ORAL
  Filled 2019-11-12 (×4): qty 1

## 2019-11-12 MED ORDER — ALBUTEROL SULFATE HFA 108 (90 BASE) MCG/ACT IN AERS
2.0000 | INHALATION_SPRAY | Freq: Four times a day (QID) | RESPIRATORY_TRACT | Status: DC | PRN
  Administered 2019-11-13: 2 via RESPIRATORY_TRACT
  Filled 2019-11-12: qty 6.7

## 2019-11-12 MED ORDER — INSULIN ASPART 100 UNIT/ML ~~LOC~~ SOLN: 0.0000 [IU] | Freq: Every day | SUBCUTANEOUS | Status: DC

## 2019-11-12 MED ORDER — HYDRALAZINE HCL 20 MG/ML IJ SOLN
10.0000 mg | Freq: Four times a day (QID) | INTRAMUSCULAR | Status: DC | PRN
  Administered 2019-11-12: 10 mg via INTRAVENOUS
  Filled 2019-11-12: qty 1

## 2019-11-12 MED ORDER — SODIUM CHLORIDE 0.9 % IV SOLN: 100.0000 mL | INTRAVENOUS | Status: DC | PRN

## 2019-11-12 MED ORDER — FUROSEMIDE 20 MG PO TABS
20.0000 mg | ORAL_TABLET | Freq: Every day | ORAL | Status: DC
  Administered 2019-11-12 – 2019-11-16 (×4): 20 mg via ORAL
  Filled 2019-11-12 (×4): qty 1

## 2019-11-12 MED ORDER — LIDOCAINE HCL (PF) 1 % IJ SOLN
5.0000 mL | INTRAMUSCULAR | Status: DC | PRN
  Filled 2019-11-12: qty 5

## 2019-11-12 MED ORDER — LIDOCAINE-PRILOCAINE 2.5-2.5 % EX CREA
1.0000 "application " | TOPICAL_CREAM | CUTANEOUS | Status: DC | PRN
  Filled 2019-11-12: qty 5

## 2019-11-12 MED ORDER — PENTAFLUOROPROP-TETRAFLUOROETH EX AERO
1.0000 "application " | INHALATION_SPRAY | CUTANEOUS | Status: DC | PRN
  Filled 2019-11-12: qty 116

## 2019-11-12 MED ORDER — SODIUM CHLORIDE 0.9 % IV SOLN
200.0000 mg | Freq: Once | INTRAVENOUS | Status: AC
  Administered 2019-11-12: 05:00:00 200 mg via INTRAVENOUS
  Filled 2019-11-12: qty 200

## 2019-11-12 MED ORDER — CARVEDILOL 6.25 MG PO TABS
6.2500 mg | ORAL_TABLET | Freq: Two times a day (BID) | ORAL | Status: DC
  Administered 2019-11-12 – 2019-11-13 (×3): 6.25 mg via ORAL
  Filled 2019-11-12 (×3): qty 1

## 2019-11-12 MED ORDER — METOPROLOL TARTRATE 50 MG PO TABS: 50.0000 mg | ORAL_TABLET | Freq: Two times a day (BID) | ORAL | Status: DC

## 2019-11-12 MED ORDER — ALTEPLASE 2 MG IJ SOLR: 2.0000 mg | Freq: Once | INTRAMUSCULAR | Status: DC | PRN

## 2019-11-12 MED ORDER — HEPARIN SODIUM (PORCINE) 5000 UNIT/ML IJ SOLN
7500.0000 [IU] | Freq: Three times a day (TID) | INTRAMUSCULAR | Status: DC
  Administered 2019-11-12 – 2019-11-16 (×11): 7500 [IU] via SUBCUTANEOUS
  Filled 2019-11-12 (×11): qty 2

## 2019-11-12 MED ORDER — AMLODIPINE BESYLATE 10 MG PO TABS
10.0000 mg | ORAL_TABLET | ORAL | Status: AC
  Administered 2019-11-12: 10 mg via ORAL
  Filled 2019-11-12: qty 1

## 2019-11-12 MED ORDER — SODIUM CHLORIDE 0.9 % IV SOLN
100.0000 mg | Freq: Every day | INTRAVENOUS | Status: AC
  Administered 2019-11-13 – 2019-11-16 (×4): 100 mg via INTRAVENOUS
  Filled 2019-11-12 (×4): qty 20

## 2019-11-12 MED ORDER — DEXAMETHASONE SODIUM PHOSPHATE 10 MG/ML IJ SOLN
6.0000 mg | Freq: Every day | INTRAMUSCULAR | Status: DC
  Administered 2019-11-12 – 2019-11-13 (×2): 6 mg via INTRAVENOUS
  Filled 2019-11-12 (×2): qty 1

## 2019-11-12 MED ORDER — AMLODIPINE BESYLATE 10 MG PO TABS
10.0000 mg | ORAL_TABLET | Freq: Every day | ORAL | Status: DC
  Administered 2019-11-13 – 2019-11-16 (×3): 10 mg via ORAL
  Filled 2019-11-12 (×3): qty 1

## 2019-11-12 MED ORDER — SODIUM CHLORIDE 0.9 % IV SOLN
500.0000 mg | Freq: Every day | INTRAVENOUS | Status: DC
  Administered 2019-11-12: 500 mg via INTRAVENOUS
  Filled 2019-11-12: qty 500

## 2019-11-12 MED ORDER — INSULIN ASPART 100 UNIT/ML ~~LOC~~ SOLN
0.0000 [IU] | Freq: Three times a day (TID) | SUBCUTANEOUS | Status: DC
  Administered 2019-11-12 – 2019-11-13 (×3): 1 [IU] via SUBCUTANEOUS

## 2019-11-12 MED ORDER — CHLORHEXIDINE GLUCONATE CLOTH 2 % EX PADS
6.0000 | MEDICATED_PAD | Freq: Every day | CUTANEOUS | Status: DC
  Administered 2019-11-12 – 2019-11-16 (×5): 6 via TOPICAL

## 2019-11-12 MED ORDER — HYDRALAZINE HCL 20 MG/ML IJ SOLN
10.0000 mg | Freq: Once | INTRAMUSCULAR | Status: AC
  Administered 2019-11-12: 10 mg via INTRAVENOUS
  Filled 2019-11-12: qty 1

## 2019-11-12 MED ORDER — HEPARIN SODIUM (PORCINE) 1000 UNIT/ML DIALYSIS
1000.0000 [IU] | INTRAMUSCULAR | Status: DC | PRN
  Administered 2019-11-14: 1000 [IU] via INTRAVENOUS_CENTRAL
  Filled 2019-11-12: qty 1

## 2019-11-12 NOTE — ED Notes (Signed)
RN noticed pt more lethargic. Dozing off while conversing with RN. Dr. Leonides Schanz aware.   CBG 67 increased to 80, with no resolution of symptoms  RT collected ABG pending.

## 2019-11-12 NOTE — Progress Notes (Signed)
Reason for Consult: To manage dialysis and dialysis related needs Referring Physician:  Dr. Logan Bores is an 54 y.o. male.  HPI: Pt is a 57M with ESRD on HD, DM, HTN, cerebral artery aneurysm s/p repair who is now seen in consultation at the request of Dr. Candiss Norse for management of ESRD and provision of HD.  Pt presented to the ED last night with increasing SOB.  He completed 1.5 hrs of HD on Saturday.  Started to feel more SOB and came to ED.  Noted to be COVID +.  Has patchy opacities in CXR, infectious vs edema.    Has been on dialysis for the past 8-9 months.  Has TDC, no perm access. Notes that sometimes he "doesn't do his HD right", meaning he cuts his time short.   BP is up here--> 190s/110s, didn't take any meds in some time now.  Dialyzes at [facility], records forthcoming.  Past Medical History:  Diagnosis Date  . Cerebral aneurysm 07/27/2011  . DM (diabetes mellitus) ([place]) 07/27/2011  . HTN (hypertension) 07/27/2011    Past Surgical History:  Procedure Laterality Date  . CEREBRAL ANEURYSM REPAIR      Family History  Problem Relation Age of Onset  . Coronary artery disease Father   . Heart disease Father   . Cerebral aneurysm Sister   . Hypertension Sister   . Colon cancer Neg Hx   . Prostate cancer Neg Hx     Social History:  reports that he has been smoking. He has been smoking about 0.25 packs per day. He does not have any smokeless tobacco history on file. He reports current alcohol use of about 12.0 standard drinks of alcohol per week. He reports that he does not use drugs.  Allergies: No Known Allergies  Medications:  Scheduled: . carvedilol  6.25 mg Oral BID WC  . Chlorhexidine Gluconate Cloth  6 each Topical Q0600  . dexamethasone (DECADRON) injection  6 mg Intravenous Daily  . heparin  7,500 Units Subcutaneous Q8H  . insulin aspart  0-5 Units Subcutaneous QHS  . insulin aspart  0-9 Units Subcutaneous TID WC     Results for orders placed  or performed during the hospital encounter of 11/11/19 (from the past 48 hour(s))  CBC with Differential     Status: Abnormal   Collection Time: 11/11/19 11:44 PM  Result Value Ref Range   WBC 7.6 4.0 - 10.5 K/uL   RBC 3.59 (L) 4.22 - 5.81 MIL/uL   Hemoglobin 10.9 (L) 13.0 - 17.0 g/dL   HCT 36.8 (L) 39.0 - 52.0 %   MCV 102.5 (H) 80.0 - 100.0 fL   MCH 30.4 26.0 - 34.0 pg   MCHC 29.6 (L) 30.0 - 36.0 g/dL   RDW 18.3 (H) 11.5 - 15.5 %   Platelets 177 150 - 400 K/uL   nRBC 0.0 0.0 - 0.2 %   Neutrophils Relative % 78 %   Neutro Abs 5.9 1.7 - 7.7 K/uL   Lymphocytes Relative 12 %   Lymphs Abs 0.9 0.7 - 4.0 K/uL   Monocytes Relative 8 %   Monocytes Absolute 0.6 0.1 - 1.0 K/uL   Eosinophils Relative 2 %   Eosinophils Absolute 0.1 0.0 - 0.5 K/uL   Basophils Relative 0 %   Basophils Absolute 0.0 0.0 - 0.1 K/uL   Immature Granulocytes 0 %   Abs Immature Granulocytes 0.03 0.00 - 0.07 K/uL    Comment: Performed at [facility], [address] [place], [place] [address]  Comprehensive metabolic panel     Status: Abnormal   Collection Time: 11/11/19 11:44 PM  Result Value Ref Range   Sodium 140 135 - 145 mmol/L   Potassium 4.5 3.5 - 5.1 mmol/L   Chloride 106 98 - 111 mmol/L   CO2 21 (L) 22 - 32 mmol/L   Glucose, Bld 85 70 - 99 mg/dL   BUN 60 (H) 6 - 20 mg/dL   Creatinine, Ser 13.34 (H) 0.61 - 1.24 mg/dL   Calcium 8.6 (L) 8.9 - 10.3 mg/dL   Total Protein 6.7 6.5 - 8.1 g/dL   Albumin 3.3 (L) 3.5 - 5.0 g/dL   AST 14 (L) 15 - 41 U/L   ALT 20 0 - 44 U/L   Alkaline Phosphatase 52 38 - 126 U/L   Total Bilirubin 0.5 0.3 - 1.2 mg/dL   GFR calc non Af Amer 4 (L) >60 mL/min   GFR calc Af Amer 4 (L) >60 mL/min   Anion gap 13 5 - 15    Comment: Performed at [facility], [place] [address] [place], [place] [address]  CBG monitoring, ED     Status: Abnormal   Collection Time: 11/11/19 11:48 PM  Result Value Ref Range   Glucose-Capillary 67 (L) 70 - 99 mg/dL  Respiratory Panel by RT PCR  (Flu A&B, Covid) - Nasopharyngeal Swab     Status: Abnormal   Collection Time: 11/12/19 12:01 AM   Specimen: Nasopharyngeal Swab  Result Value Ref Range   SARS Coronavirus 2 by RT PCR POSITIVE (A) NEGATIVE    Comment: RESULT CALLED TO, READ BACK BY AND VERIFIED WITH: H. MASHBURN,RN 0214 11/12/2019 T. TYSOR (NOTE) SARS-CoV-2 target nucleic acids are DETECTED. SARS-CoV-2 RNA is generally detectable in upper respiratory specimens  during the acute phase of infection. Positive results are indicative of the presence of the identified virus, but do not rule out bacterial infection or co-infection with other pathogens not detected by the test. Clinical correlation with patient history and other diagnostic information is necessary to determine patient infection status. The expected result is Negative. Fact Sheet for Patients:  PinkCheek.be Fact Sheet for Healthcare Providers: GravelBags.it This test is not yet approved or cleared by the [place] FDA and  has been authorized for detection and/or diagnosis of SARS-CoV-2 by FDA under an Emergency Use Authorization (EUA).  This EUA will remain in effect (meaning this test can be use d) for the duration of  the COVID-19 declaration under Section 564(b)(1) of the Act, 21 U.S.C. section 360bbb-3(b)(1), unless the authorization is terminated or revoked sooner.    Influenza A by PCR NEGATIVE NEGATIVE   Influenza B by PCR NEGATIVE NEGATIVE    Comment: (NOTE) The Xpert Xpress SARS-CoV-2/FLU/RSV assay is intended as an aid in  the diagnosis of influenza from Nasopharyngeal swab specimens and  should not be used as a sole basis for treatment. Nasal washings and  aspirates are unacceptable for Xpert Xpress SARS-CoV-2/FLU/RSV  testing. Fact Sheet for Patients: PinkCheek.be Fact Sheet for Healthcare Providers: GravelBags.it This test  is not yet approved or cleared by the [place] FDA and  has been authorized for detection and/or diagnosis of SARS-CoV-2 by  FDA under an Emergency Use Authorization (EUA). This EUA will remain  in effect (meaning this test can be used) for the duration of the  Covid-19 declaration under Section 564(b)(1) of the Act, 21  U.S.C. section 360bbb-3(b)(1), unless the authorization is  terminated  or revoked. Performed at [facility], [place] [address], [place], [place] [address]   I-STAT 7, (LYTES, BLD GAS, ICA, H+H)     Status: Abnormal   Collection Time: 11/12/19 12:02 AM  Result Value Ref Range   pH, Arterial 7.293 (L) 7.350 - 7.450   pCO2 arterial 49.6 (H) 32.0 - 48.0 mmHg   pO2, Arterial 81.0 (L) 83.0 - 108.0 mmHg   Bicarbonate 24.2 20.0 - 28.0 mmol/L   TCO2 26 22 - 32 mmol/L   O2 Saturation 95.0 %   Acid-base deficit 3.0 (H) 0.0 - 2.0 mmol/L   Sodium 139 135 - 145 mmol/L   Potassium 4.3 3.5 - 5.1 mmol/L   Calcium, Ion 1.23 1.15 - 1.40 mmol/L   HCT 32.0 (L) 39.0 - 52.0 %   Hemoglobin 10.9 (L) 13.0 - 17.0 g/dL   Patient temperature 97.7 F    Sample type ARTERIAL   CBG monitoring, ED     Status: None   Collection Time: 11/12/19 12:11 AM  Result Value Ref Range   Glucose-Capillary 80 70 - 99 mg/dL  ABO/Rh     Status: None   Collection Time: 11/12/19  3:00 AM  Result Value Ref Range   ABO/RH(D)      B POS Performed at [facility], [place] [address], [place], [place] [address]   HIV Antibody (routine testing w rflx)     Status: None   Collection Time: 11/12/19  3:31 AM  Result Value Ref Range   HIV Screen 4th Generation wRfx NON REACTIVE NON REACTIVE    Comment: Performed at [facility] [address], [place], [place] [address]  Brain natriuretic peptide     Status: Abnormal   Collection Time: 11/12/19  3:31 AM  Result Value Ref Range   B Natriuretic Peptide 839.5 (H) 0.0 - 100.0 pg/mL    Comment: Performed at [facility] [address]  [place], [place] [address]  C-reactive protein     Status: Abnormal   Collection Time: 11/12/19  3:31 AM  Result Value Ref Range   CRP 1.8 (H) <1.0 mg/dL    Comment: Performed at [facility] [address] [place], [place] [address]  D-dimer, quantitative (not at [facility])     Status: Abnormal   Collection Time: 11/12/19  3:31 AM  Result Value Ref Range   D-Dimer, Quant 2.30 (H) 0.00 - 0.50 ug/mL-FEU    Comment: (NOTE) At the manufacturer cut-off of 0.50 ug/mL FEU, this assay has been documented to exclude PE with a sensitivity and negative predictive value of 97 to 99%.  At this time, this assay has not been approved by the FDA to exclude DVT/VTE. Results should be correlated with clinical presentation. Performed at [facility], [place] [address] [place], [place] [address]   Ferritin     Status: None   Collection Time: 11/12/19  3:31 AM  Result Value Ref Range   Ferritin 136 24 - 336 ng/mL    Comment: Performed at [facility], [place] [address] [place], [place] [address]  Lactate dehydrogenase     Status: None   Collection Time: 11/12/19  3:31 AM  Result Value Ref Range   LDH 177 98 - 192 U/L    Comment: Performed at [facility], [place] [address], [place], [place] [address]  Procalcitonin     Status: None   Collection Time: 11/12/19  3:31 AM  Result Value Ref Range   Procalcitonin 0.85 ng/mL    Comment:        Interpretation: PCT > 0.5 ng/mL and <= 2 ng/mL: Systemic infection (sepsis) is possible, but other conditions are known to elevate PCT as well. (NOTE)       Sepsis PCT Algorithm           Lower Respiratory Tract                                      Infection PCT Algorithm    ----------------------------     ----------------------------         PCT < 0.25 ng/mL                PCT < 0.10 ng/mL         Strongly encourage             Strongly discourage   discontinuation of antibiotics    initiation of antibiotics    ----------------------------      -----------------------------       PCT 0.25 - 0.50 ng/mL            PCT 0.10 - 0.25 ng/mL               OR       >80% decrease in PCT            Discourage initiation of                                            antibiotics      Encourage discontinuation           of antibiotics    ----------------------------     -----------------------------         PCT >= 0.50 ng/mL              PCT 0.26 - 0.50 ng/mL                AND       <80% decrease in PCT             Encourage initiation of                                             antibiotics       Encourage continuation           of antibiotics    ----------------------------     -----------------------------        PCT >= 0.50 ng/mL                  PCT > 0.50 ng/mL               AND         increase in PCT                  Strongly encourage                                      initiation of antibiotics    Strongly encourage escalation  of antibiotics                                     -----------------------------                                           PCT <= 0.25 ng/mL                                                 OR                                        > 80% decrease in PCT                                     Discontinue / Do not initiate                                             antibiotics Performed at [facility], [place] [address] [place], [place] [address]   Troponin I (High Sensitivity)     Status: Abnormal   Collection Time: 11/12/19  3:31 AM  Result Value Ref Range   Troponin I (High Sensitivity) 18 (H) <18 ng/L    Comment: (NOTE) Elevated high sensitivity troponin I (hsTnI) values and significant  changes across serial measurements may suggest ACS but many other  chronic and acute conditions are known to elevate hsTnI results.  Refer to the "Links" section for chest pain algorithms and additional  guidance. Performed at [facility], [place] [address] [place], [place] [address]    Fibrinogen     Status: None   Collection Time: 11/12/19  3:31 AM  Result Value Ref Range   Fibrinogen 418 210 - 475 mg/dL    Comment: Performed at [facility] [address], [place], [place] [address]  Hepatitis B surface antigen     Status: Abnormal   Collection Time: 11/12/19  3:31 AM  Result Value Ref Range   Hepatitis B Surface Ag Reactive (A) NON REACTIVE    Comment: Sample Positive for HBsAg. Result confirmed by neutralization. Performed at [facility], [place] [address], [place], [place] [address]   CBC     Status: Abnormal   Collection Time: 11/12/19  3:31 AM  Result Value Ref Range   WBC 7.9 4.0 - 10.5 K/uL   RBC 3.67 (L) 4.22 - 5.81 MIL/uL   Hemoglobin 11.0 (L) 13.0 - 17.0 g/dL   HCT 37.0 (L) 39.0 - 52.0 %   MCV 100.8 (H) 80.0 - 100.0 fL   MCH 30.0 26.0 - 34.0 pg   MCHC 29.7 (L) 30.0 - 36.0 g/dL   RDW 18.4 (H) 11.5 - 15.5 %   Platelets 190 150 - 400 K/uL   nRBC 0.0 0.0 - 0.2 %    Comment: Performed at [facility],  [address], [place], [place] [address]  Comprehensive metabolic panel     Status: Abnormal   Collection Time: 11/12/19  3:31 AM  Result Value Ref Range   Sodium 139 135 - 145 mmol/L   Potassium 4.4 3.5 - 5.1 mmol/L   Chloride 104 98 - 111 mmol/L   CO2 23 22 - 32 mmol/L   Glucose, Bld 95 70 - 99 mg/dL   BUN 59 (H) 6 - 20 mg/dL   Creatinine, Ser 13.65 (H) 0.61 - 1.24 mg/dL   Calcium 8.6 (L) 8.9 - 10.3 mg/dL   Total Protein 6.9 6.5 - 8.1 g/dL   Albumin 3.5 3.5 - 5.0 g/dL   AST 14 (L) 15 - 41 U/L   ALT 20 0 - 44 U/L   Alkaline Phosphatase 60 38 - 126 U/L   Total Bilirubin 0.5 0.3 - 1.2 mg/dL   GFR calc non Af Amer 4 (L) >60 mL/min   GFR calc Af Amer 4 (L) >60 mL/min   Anion gap 12 5 - 15    Comment: Performed at [facility], [place] [address], [place], [place] [address]  Hemoglobin A1c     Status: None   Collection Time: 11/12/19  3:31 AM  Result Value Ref Range   Hgb A1c MFr Bld 4.9 4.8 - 5.6 %    Comment: (NOTE) Pre  diabetes:          5.7%-6.4% Diabetes:              >6.4% Glycemic control for   <7.0% adults with diabetes    Mean Plasma Glucose 93.93 mg/dL    Comment: Performed at [facility] [address], [place], [place] [address]  Troponin I (High Sensitivity)     Status: Abnormal   Collection Time: 11/12/19  7:39 AM  Result Value Ref Range   Troponin I (High Sensitivity) 19 (H) <18 ng/L    Comment: (NOTE) Elevated high sensitivity troponin I (hsTnI) values and significant  changes across serial measurements may suggest ACS but many other  chronic and acute conditions are known to elevate hsTnI results.  Refer to the "Links" section for chest pain algorithms and additional  guidance. Performed at [facility], [place] [address] [place], [place] [address]   CBG monitoring, ED     Status: Abnormal   Collection Time: 11/12/19  8:41 AM  Result Value Ref Range   Glucose-Capillary 165 (H) 70 - 99 mg/dL  Glucose, capillary     Status: Abnormal   Collection Time: 11/12/19 12:20 PM  Result Value Ref Range   Glucose-Capillary 106 (H) 70 - 99 mg/dL    DG Chest Portable 1 View  Result Date: 11/11/2019 CLINICAL DATA:  Shortness of breath EXAM: PORTABLE CHEST 1 VIEW COMPARISON:  09/12/2019, 07/16/2019 FINDINGS: Right-sided central venous catheter tip over the proximal right atrium. Cardiomegaly with central vascular congestion. Patchy mid and basilar lung opacities. No pleural effusion or pneumothorax. Stable linear density projecting over the left apex. IMPRESSION: 1. Cardiomegaly with central vascular congestion. 2. Hazy and patchy airspace opacity in the mid and lower lungs, possible pneumonia. Electronically Signed   By: Donavan Foil M.D.   On: 11/11/2019 23:39    ROS: all other systems reviewed and are negative except for HPI Blood pressure (!) 190/118, pulse 85, temperature 97.6 F (36.4 C), temperature source Oral, resp. rate 19, height 6\' 1"  (1.854 m), weight 128.4 kg, SpO2 96  %. Marland Kitchen  GEN  NAD, sitting on edge of bed HEENT EOMI PERRL wearing mask NECK + JVD to angle of mandible PULM on O2, diffuse crackles CV RRR ABD soft EXT 3++ woody edema to the thigh NEURO AAO x 3 no asterixis ACCESS: R IJ TDC c/d/i  Assessment/Plan: 1COVID infection: remdesivir/ decadron per primary.   2 ESRD: TTS.  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 Metabolic Bone Disease: will get records for binders 6.  Dispo: pending  Madelon Lips 11/12/2019, 12:22 PM

## 2019-11-12 NOTE — ED Notes (Signed)
Attempted report x1. 

## 2019-11-12 NOTE — Progress Notes (Signed)
Renal Navigator notes patient's COVID positive status. Patient's OP HD clinic is [facility]).  Navigator contacted [employer] and spoke with Clinic Manager/Shaquana. She states patient was positive for COVID in December (09/12/19). He typically treats on a TTS schedule. She is not certain what shift he will need to treat on, since she is unsure if patient is still testing positive or if he is reinfected. Navigator will follow and update [place] manager. Renal Navigator requested orders from clinic and updated Nephrologist/Dr. Hollie Salk.  Alphonzo Cruise, [place] Renal Navigator [phone]

## 2019-11-12 NOTE — Plan of Care (Signed)
  Problem: Education: Goal: Knowledge of risk factors and measures for prevention of condition will improve Outcome: Progressing   Problem: Respiratory: Goal: Will maintain a patent airway Outcome: Progressing Goal: Complications related to the disease process, condition or treatment will be avoided or minimized Outcome: Progressing   Problem: Education: Goal: Knowledge of risk factors and measures for prevention of condition will improve 11/12/2019 1531 by Linton Flemings, RN Outcome: Progressing 11/12/2019 1531 by Linton Flemings, RN Outcome: Progressing   Problem: Coping: Goal: Psychosocial and spiritual needs will be supported Outcome: Progressing   Problem: Respiratory: Goal: Will maintain a patent airway 11/12/2019 1531 by Linton Flemings, RN Outcome: Progressing 11/12/2019 1531 by Linton Flemings, RN Outcome: Progressing Goal: Complications related to the disease process, condition or treatment will be avoided or minimized 11/12/2019 1531 by Linton Flemings, RN Outcome: Progressing 11/12/2019 1531 by Linton Flemings, RN Outcome: Progressing

## 2019-11-12 NOTE — Progress Notes (Signed)
Patient refused the use of BIPAP, states that he just wanted to wear the oxygen. Vitals are stable, RN made aware.

## 2019-11-12 NOTE — ED Notes (Signed)
Pt refused Bipap. Pt conscious, breathing, and A&Ox4. No distress noted. RT made aware.

## 2019-11-12 NOTE — H&P (Addendum)
TRH H&P    Patient Demographics:    Jake Williams, is a 54 y.o. male  MRN: [id number]  DOB - [birth year]  Admit Date - 11/11/2019  Referring MD/NP/PA:  Erasmo Downer Ward  Outpatient Primary MD for the patient is No primary care provider on file.  Patient coming from:  home  Chief complaint-  sob   HPI:    Jake Williams  is a 54 y.o. male,  w hypertension, Dm2, ESRD on HD TTS, apparently had partial dialysis  Saturday and presents with dyspnea. Pt notes 3-4 lbs of weight gain since yesterday, and also orthopnea.   Pt denies fever, chills, cough, cp, palp, n/v, abd pain, diarrhea, brbpr, black stool.   In Ed,  T 97.7, P 77 , R 19, Bp 190/122, pox 94% on RA-  89% on RA  Wt 128.4kg Wbc 7.6, hgb 10.9, Plt 177 mcv 102.5 Na 140, K 4.5, Bun 60, Creatinine 13.34 hco3 21 AG 13 Ast 14, Alt 20,  covid -19 pcr +  CXR IMPRESSION: 1. Cardiomegaly with central vascular congestion. 2. Hazy and patchy airspace opacity in the mid and lower lungs, possible pneumonia.  Pt will be admitted for dyspnea secondary to covid-19, and also not having completed dialysis, and also pneumonia.         Review of systems:    In addition to the HPI above,  No Fever-chills, No Headache, No changes with Vision or hearing, No problems swallowing food or Liquids, No Chest pain, Cough   No Abdominal pain, No Nausea or Vomiting, bowel movements are regular, No Blood in stool or Urine, No dysuria, No new skin rashes or bruises, No new joints pains-aches,  No new weakness, tingling, numbness in any extremity, No recent weight gain or loss, No polyuria, polydypsia or polyphagia, No significant Mental Stressors.  All other systems reviewed and are negative.    Past History of the following :    Past Medical History:  Diagnosis Date  . Cerebral aneurysm 07/27/2011  . DM (diabetes mellitus) ([place]) 07/27/2011  . HTN  (hypertension) 07/27/2011      Past Surgical History:  Procedure Laterality Date  . CEREBRAL ANEURYSM REPAIR        Social History:      Social History   Tobacco Use  . Smoking status: Current Every Day Smoker    Packs/day: 0.25  Substance Use Topics  . Alcohol use: Yes    Alcohol/week: 12.0 standard drinks    Types: 12 Cans of beer per week       Family History :     Family History  Problem Relation Age of Onset  . Coronary artery disease Father   . Heart disease Father   . Cerebral aneurysm Sister   . Hypertension Sister   . Colon cancer Neg Hx   . Prostate cancer Neg Hx        Home Medications:   Prior to Admission medications   Medication Sig Start Date End Date Taking? Authorizing Provider  acetaminophen (TYLENOL) 325 MG tablet Take  650 mg by mouth every 6 (six) hours as needed.    [provider]  amLODipine (NORVASC) 10 MG tablet Take 1 tablet (10 mg total) by mouth daily. 08/25/15   Caren Macadam, MD  furosemide (LASIX) 20 MG tablet Take 1 tablet (20 mg total) by mouth daily. 04/10/14 04/10/15  Leeanne Rio, MD  lisinopril (PRINIVIL,ZESTRIL) 40 MG tablet Take 1 tablet (40 mg total) by mouth daily. 04/17/14   Leeanne Rio, MD  lisinopril-hydrochlorothiazide (ZESTORETIC) 20-12.5 MG tablet Take 1 tablet by mouth daily. 08/25/15   Caren Macadam, MD  metoprolol (LOPRESSOR) 50 MG tablet Take 1 tablet (50 mg total) by mouth 2 (two) times daily. 08/25/15   Caren Macadam, MD     Allergies:    No Known Allergies   Physical Exam:   Vitals  Blood pressure (!) 159/98, pulse 81, temperature 97.7 F (36.5 C), temperature source Oral, resp. rate 17, height 6\' 1"  (1.854 m), weight 128.4 kg, SpO2 90 %.  1.  General: axoxo3  2. Psychiatric: euthymic  3. Neurologic: nonfocal  4. HEENMT:  Anicteric, pupils 1.86mm symmetric, direct, consensual intact Neck: trace jvd  5. Respiratory : Slight crackles right  lung base, > left lung base, trace exp wheeze Good air movement  6. Cardiovascular : rrr s1, s2,   7. Gastrointestinal:  Abd: soft, nt, nd, +bs  8. Skin:  Ext: no c/c, trace edema  9.Musculoskeletal:  Good ROM     Data Review:    CBC Recent Labs  Lab 11/11/19 2344 11/12/19 0002  WBC 7.6  --   HGB 10.9* 10.9*  HCT 36.8* 32.0*  PLT 177  --   MCV 102.5*  --   MCH 30.4  --   MCHC 29.6*  --   RDW 18.3*  --   LYMPHSABS 0.9  --   MONOABS 0.6  --   EOSABS 0.1  --   BASOSABS 0.0  --    ------------------------------------------------------------------------------------------------------------------  Results for orders placed or performed during the hospital encounter of 11/11/19 (from the past 48 hour(s))  CBC with Differential     Status: Abnormal   Collection Time: 11/11/19 11:44 PM  Result Value Ref Range   WBC 7.6 4.0 - 10.5 K/uL   RBC 3.59 (L) 4.22 - 5.81 MIL/uL   Hemoglobin 10.9 (L) 13.0 - 17.0 g/dL   HCT 36.8 (L) 39.0 - 52.0 %   MCV 102.5 (H) 80.0 - 100.0 fL   MCH 30.4 26.0 - 34.0 pg   MCHC 29.6 (L) 30.0 - 36.0 g/dL   RDW 18.3 (H) 11.5 - 15.5 %   Platelets 177 150 - 400 K/uL   nRBC 0.0 0.0 - 0.2 %   Neutrophils Relative % 78 %   Neutro Abs 5.9 1.7 - 7.7 K/uL   Lymphocytes Relative 12 %   Lymphs Abs 0.9 0.7 - 4.0 K/uL   Monocytes Relative 8 %   Monocytes Absolute 0.6 0.1 - 1.0 K/uL   Eosinophils Relative 2 %   Eosinophils Absolute 0.1 0.0 - 0.5 K/uL   Basophils Relative 0 %   Basophils Absolute 0.0 0.0 - 0.1 K/uL   Immature Granulocytes 0 %   Abs Immature Granulocytes 0.03 0.00 - 0.07 K/uL    Comment: Performed at [facility], [address], [place], [place] [address]  Comprehensive metabolic panel     Status: Abnormal   Collection Time: 11/11/19 11:44 PM  Result Value Ref Range   Sodium  140 135 - 145 mmol/L   Potassium 4.5 3.5 - 5.1 mmol/L   Chloride 106 98 - 111 mmol/L   CO2 21 (L) 22 - 32 mmol/L   Glucose, Bld 85 70 - 99 mg/dL   BUN 60  (H) 6 - 20 mg/dL   Creatinine, Ser 13.34 (H) 0.61 - 1.24 mg/dL   Calcium 8.6 (L) 8.9 - 10.3 mg/dL   Total Protein 6.7 6.5 - 8.1 g/dL   Albumin 3.3 (L) 3.5 - 5.0 g/dL   AST 14 (L) 15 - 41 U/L   ALT 20 0 - 44 U/L   Alkaline Phosphatase 52 38 - 126 U/L   Total Bilirubin 0.5 0.3 - 1.2 mg/dL   GFR calc non Af Amer 4 (L) >60 mL/min   GFR calc Af Amer 4 (L) >60 mL/min   Anion gap 13 5 - 15    Comment: Performed at [facility] [address] [place], [place] [address]  CBG monitoring, ED     Status: Abnormal   Collection Time: 11/11/19 11:48 PM  Result Value Ref Range   Glucose-Capillary 67 (L) 70 - 99 mg/dL  Respiratory Panel by RT PCR (Flu A&B, Covid) - Nasopharyngeal Swab     Status: Abnormal   Collection Time: 11/12/19 12:01 AM   Specimen: Nasopharyngeal Swab  Result Value Ref Range   SARS Coronavirus 2 by RT PCR POSITIVE (A) NEGATIVE    Comment: RESULT CALLED TO, READ BACK BY AND VERIFIED WITH: H. MASHBURN,RN 0214 11/12/2019 T. TYSOR (NOTE) SARS-CoV-2 target nucleic acids are DETECTED. SARS-CoV-2 RNA is generally detectable in upper respiratory specimens  during the acute phase of infection. Positive results are indicative of the presence of the identified virus, but do not rule out bacterial infection or co-infection with other pathogens not detected by the test. Clinical correlation with patient history and other diagnostic information is necessary to determine patient infection status. The expected result is Negative. Fact Sheet for Patients:  PinkCheek.be Fact Sheet for Healthcare Providers: GravelBags.it This test is not yet approved or cleared by the [place] FDA and  has been authorized for detection and/or diagnosis of SARS-CoV-2 by FDA under an Emergency Use Authorization (EUA).  This EUA will remain in effect (meaning this test can be use d) for the duration of  the COVID-19 declaration under  Section 564(b)(1) of the Act, 21 U.S.C. section 360bbb-3(b)(1), unless the authorization is terminated or revoked sooner.    Influenza A by PCR NEGATIVE NEGATIVE   Influenza B by PCR NEGATIVE NEGATIVE    Comment: (NOTE) The Xpert Xpress SARS-CoV-2/FLU/RSV assay is intended as an aid in  the diagnosis of influenza from Nasopharyngeal swab specimens and  should not be used as a sole basis for treatment. Nasal washings and  aspirates are unacceptable for Xpert Xpress SARS-CoV-2/FLU/RSV  testing. Fact Sheet for Patients: PinkCheek.be Fact Sheet for Healthcare Providers: GravelBags.it This test is not yet approved or cleared by the [place] FDA and  has been authorized for detection and/or diagnosis of SARS-CoV-2 by  FDA under an Emergency Use Authorization (EUA). This EUA will remain  in effect (meaning this test can be used) for the duration of the  Covid-19 declaration under Section 564(b)(1) of the Act, 21  U.S.C. section 360bbb-3(b)(1), unless the authorization is  terminated or revoked. Performed at [facility], [place] [address], [place], [place] [address]   I-STAT 7, (LYTES, BLD GAS, ICA, H+H)     Status: Abnormal  Collection Time: 11/12/19 12:02 AM  Result Value Ref Range   pH, Arterial 7.293 (L) 7.350 - 7.450   pCO2 arterial 49.6 (H) 32.0 - 48.0 mmHg   pO2, Arterial 81.0 (L) 83.0 - 108.0 mmHg   Bicarbonate 24.2 20.0 - 28.0 mmol/L   TCO2 26 22 - 32 mmol/L   O2 Saturation 95.0 %   Acid-base deficit 3.0 (H) 0.0 - 2.0 mmol/L   Sodium 139 135 - 145 mmol/L   Potassium 4.3 3.5 - 5.1 mmol/L   Calcium, Ion 1.23 1.15 - 1.40 mmol/L   HCT 32.0 (L) 39.0 - 52.0 %   Hemoglobin 10.9 (L) 13.0 - 17.0 g/dL   Patient temperature 97.7 F    Sample type ARTERIAL   CBG monitoring, ED     Status: None   Collection Time: 11/12/19 12:11 AM  Result Value Ref Range   Glucose-Capillary 80 70 - 99 mg/dL    Chemistries  Recent  Labs  Lab 11/11/19 2344 11/12/19 0002  NA 140 139  K 4.5 4.3  CL 106  --   CO2 21*  --   GLUCOSE 85  --   BUN 60*  --   CREATININE 13.34*  --   CALCIUM 8.6*  --   AST 14*  --   ALT 20  --   ALKPHOS 52  --   BILITOT 0.5  --    ------------------------------------------------------------------------------------------------------------------  ------------------------------------------------------------------------------------------------------------------ GFR: Estimated Creatinine Clearance: 9.1 mL/min (A) (by C-G formula based on SCr of 13.34 mg/dL (H)). Liver Function Tests: Recent Labs  Lab 11/11/19 2344  AST 14*  ALT 20  ALKPHOS 52  BILITOT 0.5  PROT 6.7  ALBUMIN 3.3*   No results for input(s): LIPASE, AMYLASE in the last 168 hours. No results for input(s): AMMONIA in the last 168 hours. Coagulation Profile: No results for input(s): INR, PROTIME in the last 168 hours. Cardiac Enzymes: No results for input(s): CKTOTAL, CKMB, CKMBINDEX, TROPONINI in the last 168 hours. BNP (last 3 results) No results for input(s): PROBNP in the last 8760 hours. HbA1C: No results for input(s): HGBA1C in the last 72 hours. CBG: Recent Labs  Lab 11/11/19 2348 11/12/19 0011  GLUCAP 67* 80   Lipid Profile: No results for input(s): CHOL, HDL, LDLCALC, TRIG, CHOLHDL, LDLDIRECT in the last 72 hours. Thyroid Function Tests: No results for input(s): TSH, T4TOTAL, FREET4, T3FREE, THYROIDAB in the last 72 hours. Anemia Panel: No results for input(s): VITAMINB12, FOLATE, FERRITIN, TIBC, IRON, RETICCTPCT in the last 72 hours.  --------------------------------------------------------------------------------------------------------------- Urine analysis:    Component Value Date/Time   COLORURINE YELLOW 07/27/2011 2123   APPEARANCEUR CLEAR 07/27/2011 2123   LABSPEC 1.015 07/27/2011 2123   PHURINE 6.5 07/27/2011 2123   GLUCOSEU NEGATIVE 07/27/2011 2123   HGBUR NEGATIVE 07/27/2011 2123     [place] NEGATIVE 07/27/2011 2123   [place] NEGATIVE 07/27/2011 2123   PROTEINUR 30 (A) 07/27/2011 2123   UROBILINOGEN 1.0 07/27/2011 2123   NITRITE NEGATIVE 07/27/2011 2123   LEUKOCYTESUR NEGATIVE 07/27/2011 2123      Imaging Results:    DG Chest Portable 1 View  Result Date: 11/11/2019 CLINICAL DATA:  Shortness of breath EXAM: PORTABLE CHEST 1 VIEW COMPARISON:  09/12/2019, 07/16/2019 FINDINGS: Right-sided central venous catheter tip over the proximal right atrium. Cardiomegaly with central vascular congestion. Patchy mid and basilar lung opacities. No pleural effusion or pneumothorax. Stable linear density projecting over the left apex. IMPRESSION: 1. Cardiomegaly with central vascular congestion. 2. Hazy and patchy airspace opacity in the mid  and lower lungs, possible pneumonia. Electronically Signed   By: Donavan Foil M.D.   On: 11/11/2019 23:39   ekg nsr at 26, Lad, no st-t changes c/w ischemia    Assessment & Plan:    Principal Problem:   Dyspnea Active Problems:   HTN (hypertension)   DM (diabetes mellitus) ([place])   ESRD (end stage renal disease) ([facility])   Acute respiratory failure with hypoxia ([facility])   COVID-19 virus infection   Dyspnea, Acute respiratory failure with hypoxia ddx Covid-19, pneumonia, ESRD   Covid-19 covid -19 labs, please follow up on labs, if d dimer is positive consider CTA chest Dexamethasone 6mg  iv qday Remdesivir pharmacy consult  CAP Blood culture x2 Urine strep antigen Urine legionella antigen Rocephin 2gm iv qday Zithromax 500mg  iv qday  Hypertension uncontrolled Hydralazine 10mg  iv q6h prn  Cont Amlodipine 10mg  po qday Cont Furosemide 20mg  po qday Cont Lisinopril 40mg  po qday Cont Metoprolol 50mg  po bid  ESRD on HD TTS Please consult nephrology in am Check cmp in am  Anemia Check cbc in am  Dm2 per history but patient is not medication Check hga1c fsbs ac and qhs, ISS   DVT Prophylaxis-   Heparin  SCDs   AM Labs  Ordered, also please review Full Orders  Family Communication: Admission, patients condition and plan of care including tests being ordered have been discussed with the patient  who indicate understanding and agree with the plan and Code Status.  Code Status: FULL CODE per patient, attempted to notify father of admission to [facility], no answer at home number,  Mobile is disconnected  Admission status:  Inpatient: Based on patients clinical presentation and evaluation of above clinical data, I have made determination that patient meets Inpatient criteria at this time. Pt has acute respiratory failure with hypoxia, and covid-19, and requires dialysis.  Pt will require > 2 nites stay.   Time spent in minutes : 70 minutes    Jani Gravel M.D on 11/12/2019 at 2:34 AM

## 2019-11-12 NOTE — ED Notes (Signed)
Breakfast Ordered 

## 2019-11-13 DIAGNOSIS — J9601 Acute respiratory failure with hypoxia: Secondary | ICD-10-CM

## 2019-11-13 LAB — CBC WITH DIFFERENTIAL/PLATELET
Abs Immature Granulocytes: 0.02 10*3/uL (ref 0.00–0.07)
Basophils Absolute: 0.1 10*3/uL (ref 0.0–0.1)
Basophils Relative: 1 %
Eosinophils Absolute: 0 10*3/uL (ref 0.0–0.5)
Eosinophils Relative: 1 %
HCT: 37.8 % — ABNORMAL LOW (ref 39.0–52.0)
Hemoglobin: 11.2 g/dL — ABNORMAL LOW (ref 13.0–17.0)
Immature Granulocytes: 0 %
Lymphocytes Relative: 15 %
Lymphs Abs: 1.1 10*3/uL (ref 0.7–4.0)
MCH: 29.9 pg (ref 26.0–34.0)
MCHC: 29.6 g/dL — ABNORMAL LOW (ref 30.0–36.0)
MCV: 101.1 fL — ABNORMAL HIGH (ref 80.0–100.0)
Monocytes Absolute: 0.5 10*3/uL (ref 0.1–1.0)
Monocytes Relative: 8 %
Neutro Abs: 5.4 10*3/uL (ref 1.7–7.7)
Neutrophils Relative %: 75 %
Platelets: 212 10*3/uL (ref 150–400)
RBC: 3.74 MIL/uL — ABNORMAL LOW (ref 4.22–5.81)
RDW: 18.7 % — ABNORMAL HIGH (ref 11.5–15.5)
WBC: 7.1 10*3/uL (ref 4.0–10.5)
nRBC: 0.3 % — ABNORMAL HIGH (ref 0.0–0.2)

## 2019-11-13 LAB — COMPREHENSIVE METABOLIC PANEL
ALT: 19 U/L (ref 0–44)
AST: 12 U/L — ABNORMAL LOW (ref 15–41)
Albumin: 3.4 g/dL — ABNORMAL LOW (ref 3.5–5.0)
Alkaline Phosphatase: 62 U/L (ref 38–126)
Anion gap: 15 (ref 5–15)
BUN: 73 mg/dL — ABNORMAL HIGH (ref 6–20)
CO2: 21 mmol/L — ABNORMAL LOW (ref 22–32)
Calcium: 8.7 mg/dL — ABNORMAL LOW (ref 8.9–10.3)
Chloride: 105 mmol/L (ref 98–111)
Creatinine, Ser: 14.6 mg/dL — ABNORMAL HIGH (ref 0.61–1.24)
GFR calc Af Amer: 4 mL/min — ABNORMAL LOW (ref >60)
GFR calc non Af Amer: 3 mL/min — ABNORMAL LOW (ref >60)
Glucose, Bld: 109 mg/dL — ABNORMAL HIGH (ref 70–99)
Potassium: 4.8 mmol/L (ref 3.5–5.1)
Sodium: 141 mmol/L (ref 135–145)
Total Bilirubin: 0.5 mg/dL (ref 0.3–1.2)
Total Protein: 6.8 g/dL (ref 6.5–8.1)

## 2019-11-13 LAB — MAGNESIUM: Magnesium: 2.3 mg/dL (ref 1.7–2.4)

## 2019-11-13 LAB — BRAIN NATRIURETIC PEPTIDE: B Natriuretic Peptide: 838.5 pg/mL — ABNORMAL HIGH (ref 0.0–100.0)

## 2019-11-13 LAB — GLUCOSE, CAPILLARY
Glucose-Capillary: 92 mg/dL (ref 70–99)
Glucose-Capillary: 133 mg/dL — ABNORMAL HIGH (ref 70–99)
Glucose-Capillary: 125 mg/dL — ABNORMAL HIGH (ref 70–99)
Glucose-Capillary: 121 mg/dL — ABNORMAL HIGH (ref 70–99)

## 2019-11-13 LAB — APTT: aPTT: 33 seconds (ref 24–36)

## 2019-11-13 LAB — PHOSPHORUS: Phosphorus: 8.5 mg/dL — ABNORMAL HIGH (ref 2.5–4.6)

## 2019-11-13 LAB — C-REACTIVE PROTEIN: CRP: 2 mg/dL — ABNORMAL HIGH (ref <1.0)

## 2019-11-13 LAB — D-DIMER, QUANTITATIVE: D-Dimer, Quant: 2.07 ug/mL-FEU — ABNORMAL HIGH (ref 0.00–0.50)

## 2019-11-13 LAB — HEPATITIS B SURFACE ANTIGEN: Hepatitis B Surface Ag: REACTIVE — AB

## 2019-11-13 LAB — PROCALCITONIN: Procalcitonin: 0.91 ng/mL

## 2019-11-13 MED ORDER — CARVEDILOL 12.5 MG PO TABS
12.5000 mg | ORAL_TABLET | Freq: Two times a day (BID) | ORAL | Status: DC
  Administered 2019-11-13 – 2019-11-16 (×4): 12.5 mg via ORAL
  Filled 2019-11-13 (×4): qty 1

## 2019-11-13 MED ORDER — DEXAMETHASONE SODIUM PHOSPHATE 4 MG/ML IJ SOLN
2.0000 mg | Freq: Every day | INTRAMUSCULAR | Status: DC
  Administered 2019-11-14 – 2019-11-15 (×2): 2 mg via INTRAVENOUS
  Filled 2019-11-13 (×2): qty 1

## 2019-11-13 NOTE — Progress Notes (Signed)
PROGRESS NOTE                                                                                                                                                                                                             Patient Demographics:    Jake Williams, is a 54 y.o. male, DOB - [birth year], IFB:[id number]  Outpatient Primary MD for the patient is Robert Bellow, PA-C    LOS - 1  Admit date - 11/11/2019    Chief Complaint  Patient presents with  . Shortness of Breath       Brief Narrative 54 y.o. male,  w hypertension,  ESRD on HD TTS, apparently had partial dialysis  Saturday and presents with dyspnea. Pt notes 3-4 lbs of weight gain since yesterday, and also orthopnea, was also diagnosed with COVID-19 pneumonia and admitted to the hospital for further care.   Subjective:    Jake Williams today has, No headache, No chest pain, No abdominal pain - No Nausea, No new weakness tingling or numbness, improved shortness of breath.   Assessment  & Plan :     1. Acute Hypoxic Resp. Failure due to Acute Covid 19 Viral Pneumonitis during the ongoing 2020 Covid 19 Pandemic - he has mixed picture with fluid overload from ESRD and COVID-19 pneumonia.  Responding well to IV steroids and remdesivir, dialysis for fluid removal.  Continue present treatment and monitor closely.  Advance activity and titrate down steroids and oxygen.  Encouraged the patient to sit up in chair in the daytime use I-S and flutter valve for pulmonary toiletry and then prone in bed when at night.    SpO2: 98 % O2 Flow Rate (L/min): 2 L/min  Recent Labs  Lab 11/12/19 0001 11/12/19 0331 11/13/19 0919 11/13/19 0925  CRP  --  1.8* 2.0*  --   DDIMER  --  2.30* 2.07*  --   FERRITIN  --  136  --   --   BNP  --  839.5*  --  838.5*  PROCALCITON  --  0.85 0.91  --   SARSCOV2NAA POSITIVE*  --   --   --     Hepatic Function Latest Ref Rng & Units  11/13/2019 11/12/2019 11/11/2019  Total Protein 6.5 - 8.1 g/dL 6.8 6.9 6.7  Albumin 3.5 - 5.0 g/dL 3.4(L) 3.5 3.3(L)  AST 15 - 41 U/L 12(L) 14(L) 14(L)  ALT 0 - 44 U/L 19 20 20   Alk Phosphatase 38 - 126 U/L 62 60 52  Total Bilirubin 0.3 - 1.2 mg/dL 0.5 0.5 0.5  Bilirubin, Direct 0.0 - 0.3 mg/dL - - -     2.  ESRD.  On TTS schedule, nephrology on board, fluid removal through HD.  3.  Essential hypertension.  Blood pressure high despite being on combination of Norvasc, Coreg and ACE inhibitor.  Still takes Lasix which will be continued.  Will beta-blocker for better control.  Added as needed hydralazine.  4.  Steroid-induced hyperglycemia.  On sliding scale will monitor and adjust.  Lab Results  Component Value Date   HGBA1C 4.9 11/12/2019   CBG (last 3)  Recent Labs    11/12/19 1714 11/12/19 2115 11/13/19 0814  GLUCAP 129* 127* 92     Condition - Fair  Family Communication  :  None  Code Status :  Full  Diet :   Diet Order            Diet renal/carb modified with fluid restriction Diet-HS Snack? Nothing; Fluid restriction: 1200 mL Fluid; Room service appropriate? Yes; Fluid consistency: Thin  Diet effective now               Disposition Plan  : In the hospital for COVID-19 treatment with IV remdesivir  Consults  : Nephrology  Procedures  :  None  PUD Prophylaxis :   DVT Prophylaxis  :   Heparin    Lab Results  Component Value Date   PLT 212 11/13/2019    Inpatient Medications  Scheduled Meds: . amLODipine  10 mg Oral Daily  . carvedilol  6.25 mg Oral BID WC  . Chlorhexidine Gluconate Cloth  6 each Topical Q0600  . dexamethasone (DECADRON) injection  6 mg Intravenous Daily  . furosemide  20 mg Oral Daily  . heparin  7,500 Units Subcutaneous Q8H  . insulin aspart  0-5 Units Subcutaneous QHS  . insulin aspart  0-9 Units Subcutaneous TID WC  . lisinopril  40 mg Oral Daily   Continuous Infusions: . sodium chloride    . sodium chloride    .  remdesivir 100 mg in NS 100 mL 100 mg (11/13/19 0925)   PRN Meds:.sodium chloride, sodium chloride, albuterol, alteplase, heparin, hydrALAZINE, lidocaine (PF), lidocaine-prilocaine, pentafluoroprop-tetrafluoroeth  Antibiotics  :    Anti-infectives (From admission, onward)   Start     Dose/Rate Route Frequency Ordered Stop   11/13/19 1000  remdesivir 100 mg in sodium chloride 0.9 % 100 mL IVPB     100 mg 200 mL/hr over 30 Minutes Intravenous Daily 11/12/19 0229 11/17/19 0959   11/12/19 0315  cefTRIAXone (ROCEPHIN) 2 g in sodium chloride 0.9 % 100 mL IVPB  Status:  Discontinued     2 g 200 mL/hr over 30 Minutes Intravenous Daily at bedtime 11/12/19 0256 11/12/19 0717   11/12/19 0315  azithromycin (ZITHROMAX) 500 mg in sodium chloride 0.9 % 250 mL IVPB  Status:  Discontinued     500 mg 250 mL/hr over 60 Minutes Intravenous Daily at bedtime 11/12/19 0256 11/12/19 0717   11/12/19 0300  remdesivir 200 mg in sodium chloride 0.9% 250 mL IVPB     200 mg 580 mL/hr over 30 Minutes Intravenous Once 11/12/19 0229 11/12/19 0521       Time Spent in minutes  30   Lala Lund M.D on 11/13/2019 at 12:17 PM  To page go to [email or web address] - password [facility]  [facility] -  Office  [phone]     See all Orders from today for further details    Objective:   Vitals:   11/13/19 0400 11/13/19 0416 11/13/19 0444 11/13/19 0916  BP: (!) 157/110  (!) 183/108 (!) 165/95  Pulse: 77  80 76  Resp: 16 17 18 17   Temp:   98 F (36.7 C) 97.7 F (36.5 C)  TempSrc:   Oral Oral  SpO2: 100%  96% 98%  Weight:  134.3 kg 134.5 kg   Height:        Wt Readings from Last 3 Encounters:  11/13/19 134.5 kg  08/25/15 128.4 kg  04/17/14 135.4 kg     Intake/Output Summary (Last 24 hours) at 11/13/2019 1217 Last data filed at 11/13/2019 0429 Gross per 24 hour  Intake 360 ml  Output 2639 ml  Net -2279 ml     Physical Exam  Awake Alert, No new F.N deficits, Normal affect [place].AT,PERRAL Supple  Neck,No JVD, No cervical lymphadenopathy appriciated.  Symmetrical Chest wall movement, Good air movement bilaterally, CTAB RRR,No Gallops,Rubs or new Murmurs, No Parasternal Heave +ve B.Sounds, Abd Soft, No tenderness, No organomegaly appriciated, No rebound - guarding or rigidity. No Cyanosis, Clubbing or edema, No new Rash or bruise       Data Review:    CBC Recent Labs  Lab 11/11/19 2344 11/12/19 0002 11/12/19 0331 11/13/19 0919  WBC 7.6  --  7.9 7.1  HGB 10.9* 10.9* 11.0* 11.2*  HCT 36.8* 32.0* 37.0* 37.8*  PLT 177  --  190 212  MCV 102.5*  --  100.8* 101.1*  MCH 30.4  --  30.0 29.9  MCHC 29.6*  --  29.7* 29.6*  RDW 18.3*  --  18.4* 18.7*  LYMPHSABS 0.9  --   --  1.1  MONOABS 0.6  --   --  0.5  EOSABS 0.1  --   --  0.0  BASOSABS 0.0  --   --  0.1    Chemistries  Recent Labs  Lab 11/11/19 2344 11/12/19 0002 11/12/19 0331 11/13/19 0919  NA 140 139 139 141  K 4.5 4.3 4.4 4.8  CL 106  --  104 105  CO2 21*  --  23 21*  GLUCOSE 85  --  95 109*  BUN 60*  --  59* 73*  CREATININE 13.34*  --  13.65* 14.60*  CALCIUM 8.6*  --  8.6* 8.7*  MG  --   --   --  2.3  AST 14*  --  14* 12*  ALT 20  --  20 19  ALKPHOS 52  --  60 62  BILITOT 0.5  --  0.5 0.5   ------------------------------------------------------------------------------------------------------------------ No results for input(s): CHOL, HDL, LDLCALC, TRIG, CHOLHDL, LDLDIRECT in the last 72 hours.  Lab Results  Component Value Date   HGBA1C 4.9 11/12/2019   ------------------------------------------------------------------------------------------------------------------ No results for input(s): TSH, T4TOTAL, T3FREE, THYROIDAB in the last 72 hours.  Invalid input(s): FREET3  Cardiac Enzymes No results for input(s): CKMB, TROPONINI, MYOGLOBIN in the last 168 hours.  Invalid input(s): CK ------------------------------------------------------------------------------------------------------------------      Component Value Date/Time   BNP 838.5 (H) 11/13/2019 6803    Micro Results Recent Results (from the past 240 hour(s))  Respiratory Panel by RT PCR (Flu A&B, Covid) - Nasopharyngeal Swab     Status: Abnormal   Collection Time: 11/12/19 12:01 AM   Specimen: Nasopharyngeal Swab  Result  Value Ref Range Status   SARS Coronavirus 2 by RT PCR POSITIVE (A) NEGATIVE Final    Comment: RESULT CALLED TO, READ BACK BY AND VERIFIED WITH: H. MASHBURN,RN 0214 11/12/2019 T. TYSOR (NOTE) SARS-CoV-2 target nucleic acids are DETECTED. SARS-CoV-2 RNA is generally detectable in upper respiratory specimens  during the acute phase of infection. Positive results are indicative of the presence of the identified virus, but do not rule out bacterial infection or co-infection with other pathogens not detected by the test. Clinical correlation with patient history and other diagnostic information is necessary to determine patient infection status. The expected result is Negative. Fact Sheet for Patients:  PinkCheek.be Fact Sheet for Healthcare Providers: GravelBags.it This test is not yet approved or cleared by the [place] FDA and  has been authorized for detection and/or diagnosis of SARS-CoV-2 by FDA under an Emergency Use Authorization (EUA).  This EUA will remain in effect (meaning this test can be use d) for the duration of  the COVID-19 declaration under Section 564(b)(1) of the Act, 21 U.S.C. section 360bbb-3(b)(1), unless the authorization is terminated or revoked sooner.    Influenza A by PCR NEGATIVE NEGATIVE Final   Influenza B by PCR NEGATIVE NEGATIVE Final    Comment: (NOTE) The Xpert Xpress SARS-CoV-2/FLU/RSV assay is intended as an aid in  the diagnosis of influenza from Nasopharyngeal swab specimens and  should not be used as a sole basis for treatment. Nasal washings and  aspirates are unacceptable for Xpert Xpress  SARS-CoV-2/FLU/RSV  testing. Fact Sheet for Patients: PinkCheek.be Fact Sheet for Healthcare Providers: GravelBags.it This test is not yet approved or cleared by the [place] FDA and  has been authorized for detection and/or diagnosis of SARS-CoV-2 by  FDA under an Emergency Use Authorization (EUA). This EUA will remain  in effect (meaning this test can be used) for the duration of the  Covid-19 declaration under Section 564(b)(1) of the Act, 21  U.S.C. section 360bbb-3(b)(1), unless the authorization is  terminated or revoked. Performed at [facility], [place] [address], [place], [place] [address]   Culture, blood (routine x 2)     Status: None (Preliminary result)   Collection Time: 11/12/19  3:38 AM   Specimen: BLOOD  Result Value Ref Range Status   Specimen Description BLOOD RIGHT ARM  Final   Special Requests   Final    BOTTLES DRAWN AEROBIC AND ANAEROBIC Blood Culture adequate volume   Culture   Final    NO GROWTH 1 DAY Performed at [facility], [place] [address], [place], [place] [address]    Report Status PENDING  Incomplete  Culture, blood (routine x 2)     Status: None (Preliminary result)   Collection Time: 11/12/19  3:49 AM   Specimen: BLOOD  Result Value Ref Range Status   Specimen Description BLOOD RIGHT HAND  Final   Special Requests   Final    BOTTLES DRAWN AEROBIC AND ANAEROBIC Blood Culture adequate volume   Culture   Final    NO GROWTH 1 DAY Performed at [facility], [place] [address], [place], [place] [address]    Report Status PENDING  Incomplete    Radiology Reports DG Chest Portable 1 View  Result Date: 11/11/2019 CLINICAL DATA:  Shortness of breath EXAM: PORTABLE CHEST 1 VIEW COMPARISON:  09/12/2019, 07/16/2019 FINDINGS: Right-sided central venous catheter tip over the proximal right atrium. Cardiomegaly with central vascular congestion. Patchy mid and basilar lung opacities.  No  pleural effusion or pneumothorax. Stable linear density projecting over the left apex. IMPRESSION: 1. Cardiomegaly with central vascular congestion. 2. Hazy and patchy airspace opacity in the mid and lower lungs, possible pneumonia. Electronically Signed   By: Donavan Foil M.D.   On: 11/11/2019 23:39

## 2019-11-13 NOTE — Progress Notes (Signed)
  [facility] Progress Note   Assessment/ Plan:   1COVID infection: remdesivir/ decadron per primary.   2 ESRD: TTS.  HD off schedule yesterday 2/15--> marked improvement today, will dialyze MWF schedule here d/t high volume of TTS COVID + patient.   3 Hypertension: very hypertensive, expect to improve with UF and resumption of home meds--> he has a lot of volume to give so will go for healthy UF goal again tomorrow.   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 Metabolic Bone Disease: binders when eating 6.  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  Dispo: pending  Subjective:    Seen in room, had HD late last night.  Feeling better.  Breathing better, still fairly hypertensive.     Objective:   BP (!) 165/95 (BP Location: Right Arm)   Pulse 76   Temp 97.7 F (36.5 C) (Oral)   Resp 17   Ht 6\' 1"  (1.854 m)   Wt 134.5 kg   SpO2 98%   BMI 39.12 kg/m   Physical Exam: GEN NAD, sitting on edge of bed HEENT EOMI PERRL wearing mask NECK JVD improved PULM on O2, bibasilar crackles CV RRR ABD soft EXT 3++ woody edema to the thigh, some improvement NEURO AAO x 3 no asterixis ACCESS: R IJ TDC c/d/i Labs: BMET Recent Labs  Lab 11/11/19 2344 11/12/19 0002 11/12/19 0331 11/13/19 0919  NA 140 139 139 141  K 4.5 4.3 4.4 4.8  CL 106  --  104 105  CO2 21*  --  23 21*  GLUCOSE 85  --  95 109*  BUN 60*  --  59* 73*  CREATININE 13.34*  --  13.65* 14.60*  CALCIUM 8.6*  --  8.6* 8.7*  PHOS  --   --   --  8.5*   CBC Recent Labs  Lab 11/11/19 2344 11/12/19 0002 11/12/19 0331 11/13/19 0919  WBC 7.6  --  7.9 7.1  NEUTROABS 5.9  --   --  5.4  HGB 10.9* 10.9* 11.0* 11.2*  HCT 36.8* 32.0* 37.0* 37.8*  MCV 102.5*  --  100.8* 101.1*  PLT 177  --  190 212      Medications:    . amLODipine  10 mg Oral Daily  . carvedilol  12.5 mg Oral BID WC  . Chlorhexidine Gluconate Cloth  6 each Topical Q0600  . [START ON 11/14/2019]  dexamethasone (DECADRON) injection  2 mg Intravenous Daily  . furosemide  20 mg Oral Daily  . heparin  7,500 Units Subcutaneous Q8H  . insulin aspart  0-5 Units Subcutaneous QHS  . insulin aspart  0-9 Units Subcutaneous TID WC  . lisinopril  40 mg Oral Daily     Madelon Lips MD 11/13/2019, 1:52 PM

## 2019-11-14 LAB — COMPREHENSIVE METABOLIC PANEL
ALT: 16 U/L (ref 0–44)
AST: 9 U/L — ABNORMAL LOW (ref 15–41)
Albumin: 3.3 g/dL — ABNORMAL LOW (ref 3.5–5.0)
Alkaline Phosphatase: 65 U/L (ref 38–126)
Anion gap: 14 (ref 5–15)
BUN: 80 mg/dL — ABNORMAL HIGH (ref 6–20)
CO2: 21 mmol/L — ABNORMAL LOW (ref 22–32)
Calcium: 8.8 mg/dL — ABNORMAL LOW (ref 8.9–10.3)
Chloride: 103 mmol/L (ref 98–111)
Creatinine, Ser: 15.57 mg/dL — ABNORMAL HIGH (ref 0.61–1.24)
GFR calc Af Amer: 4 mL/min — ABNORMAL LOW (ref >60)
GFR calc non Af Amer: 3 mL/min — ABNORMAL LOW (ref >60)
Glucose, Bld: 103 mg/dL — ABNORMAL HIGH (ref 70–99)
Potassium: 5.5 mmol/L — ABNORMAL HIGH (ref 3.5–5.1)
Sodium: 138 mmol/L (ref 135–145)
Total Bilirubin: 0.7 mg/dL (ref 0.3–1.2)
Total Protein: 6.9 g/dL (ref 6.5–8.1)

## 2019-11-14 LAB — MAGNESIUM: Magnesium: 2.5 mg/dL — ABNORMAL HIGH (ref 1.7–2.4)

## 2019-11-14 LAB — CBC WITH DIFFERENTIAL/PLATELET
Abs Immature Granulocytes: 0.04 10*3/uL (ref 0.00–0.07)
Basophils Absolute: 0 10*3/uL (ref 0.0–0.1)
Basophils Relative: 0 %
Eosinophils Absolute: 0 10*3/uL (ref 0.0–0.5)
Eosinophils Relative: 0 %
HCT: 38.2 % — ABNORMAL LOW (ref 39.0–52.0)
Hemoglobin: 11.4 g/dL — ABNORMAL LOW (ref 13.0–17.0)
Immature Granulocytes: 1 %
Lymphocytes Relative: 11 %
Lymphs Abs: 0.8 10*3/uL (ref 0.7–4.0)
MCH: 29.7 pg (ref 26.0–34.0)
MCHC: 29.8 g/dL — ABNORMAL LOW (ref 30.0–36.0)
MCV: 99.5 fL (ref 80.0–100.0)
Monocytes Absolute: 0.6 10*3/uL (ref 0.1–1.0)
Monocytes Relative: 8 %
Neutro Abs: 6.4 10*3/uL (ref 1.7–7.7)
Neutrophils Relative %: 80 %
Platelets: 197 10*3/uL (ref 150–400)
RBC: 3.84 MIL/uL — ABNORMAL LOW (ref 4.22–5.81)
RDW: 18.7 % — ABNORMAL HIGH (ref 11.5–15.5)
WBC: 7.9 10*3/uL (ref 4.0–10.5)
nRBC: 0.8 % — ABNORMAL HIGH (ref 0.0–0.2)

## 2019-11-14 LAB — PROCALCITONIN: Procalcitonin: 0.75 ng/mL

## 2019-11-14 LAB — BRAIN NATRIURETIC PEPTIDE: B Natriuretic Peptide: 851 pg/mL — ABNORMAL HIGH (ref 0.0–100.0)

## 2019-11-14 LAB — APTT: aPTT: 32 seconds (ref 24–36)

## 2019-11-14 LAB — D-DIMER, QUANTITATIVE: D-Dimer, Quant: 2.21 ug/mL-FEU — ABNORMAL HIGH (ref 0.00–0.50)

## 2019-11-14 LAB — GLUCOSE, CAPILLARY
Glucose-Capillary: 92 mg/dL (ref 70–99)
Glucose-Capillary: 97 mg/dL (ref 70–99)
Glucose-Capillary: 96 mg/dL (ref 70–99)
Glucose-Capillary: 156 mg/dL — ABNORMAL HIGH (ref 70–99)

## 2019-11-14 LAB — C-REACTIVE PROTEIN: CRP: 1.4 mg/dL — ABNORMAL HIGH (ref <1.0)

## 2019-11-14 MED ORDER — SODIUM ZIRCONIUM CYCLOSILICATE 10 G PO PACK
10.0000 g | PACK | Freq: Once | ORAL | Status: AC
  Administered 2019-11-14: 10 g via ORAL
  Filled 2019-11-14: qty 1

## 2019-11-14 MED ORDER — CALCIUM ACETATE (PHOS BINDER) 667 MG PO CAPS
1334.0000 mg | ORAL_CAPSULE | Freq: Three times a day (TID) | ORAL | Status: DC
  Administered 2019-11-15 – 2019-11-16 (×4): 1334 mg via ORAL
  Filled 2019-11-14 (×4): qty 2

## 2019-11-14 MED ORDER — HYDROCOD POLST-CPM POLST ER 10-8 MG/5ML PO SUER
5.0000 mL | Freq: Two times a day (BID) | ORAL | Status: DC | PRN
  Administered 2019-11-14 – 2019-11-15 (×3): 5 mL via ORAL
  Filled 2019-11-14 (×3): qty 5

## 2019-11-14 MED ORDER — HEPARIN SODIUM (PORCINE) 1000 UNIT/ML IJ SOLN
INTRAMUSCULAR | Status: AC
  Filled 2019-11-14: qty 4

## 2019-11-14 NOTE — Progress Notes (Signed)
  [facility] Progress Note   Assessment/ Plan:   1COVID infection: remdesivir/ decadron per primary.   2 ESRD: TTS.  HD off schedule yesterday 2/15--> marked improvement today, will dialyze MWF schedule here d/t high volume of TTS COVID + patients.  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 Metabolic Bone Disease: binders, phoslo 2 TID AC 6.  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  Dispo: pending  Subjective:    Off O2 this AM.  Talking on phone in complete sentences, looks like he feels better.     Objective:   BP (!) 169/113 (BP Location: Left Arm)   Pulse 77   Temp 97.6 F (36.4 C) (Oral)   Resp 17   Ht 6\' 1"  (1.854 m)   Wt 134.5 kg   SpO2 95%   BMI 39.12 kg/m   Physical Exam: GEN NAD, sitting in chair HEENT EOMI PERRL wearing mask NECK JVD improved PULM on O2, bibasilar R sided crackles CV RRR ABD soft EXT 2+ woody edema to the thigh, some improvement NEURO AAO x 3 no asterixis ACCESS: R IJ TDC c/d/i  Labs: BMET Recent Labs  Lab 11/11/19 2344 11/12/19 0002 11/12/19 0331 11/13/19 0919 11/14/19 0449  NA 140 139 139 141 138  K 4.5 4.3 4.4 4.8 5.5*  CL 106  --  104 105 103  CO2 21*  --  23 21* 21*  GLUCOSE 85  --  95 109* 103*  BUN 60*  --  59* 73* 80*  CREATININE 13.34*  --  13.65* 14.60* 15.57*  CALCIUM 8.6*  --  8.6* 8.7* 8.8*  PHOS  --   --   --  8.5*  --    CBC Recent Labs  Lab 11/11/19 2344 11/11/19 2344 11/12/19 0002 11/12/19 0331 11/13/19 0919 11/14/19 0449  WBC 7.6  --   --  7.9 7.1 7.9  NEUTROABS 5.9  --   --   --  5.4 6.4  HGB 10.9*   < > 10.9* 11.0* 11.2* 11.4*  HCT 36.8*   < > 32.0* 37.0* 37.8* 38.2*  MCV 102.5*  --   --  100.8* 101.1* 99.5  PLT 177  --   --  190 212 197   < > =  values in this interval not displayed.      Medications:    . amLODipine  10 mg Oral Daily  . carvedilol  12.5 mg Oral BID WC  . Chlorhexidine Gluconate Cloth  6 each Topical Q0600  . dexamethasone (DECADRON) injection  2 mg Intravenous Daily  . furosemide  20 mg Oral Daily  . heparin  7,500 Units Subcutaneous Q8H  . insulin aspart  0-5 Units Subcutaneous QHS  . insulin aspart  0-9 Units Subcutaneous TID WC  . lisinopril  40 mg Oral Daily  . sodium zirconium cyclosilicate  10 g Oral Once     Madelon Lips MD 11/14/2019, 11:56 AM

## 2019-11-14 NOTE — Progress Notes (Signed)
PROGRESS NOTE                                                                                                                                                                                                             Patient Demographics:    Jake Williams, is a 54 y.o. male, DOB - 1965/10/16, SFK:[id number]  Outpatient Primary MD for the patient is Robert Bellow, PA-C    LOS - 2  Admit date - 11/11/2019    Chief Complaint  Patient presents with  . Shortness of Breath       Brief Narrative 54 y.o. male,  w hypertension,  ESRD on HD TTS, apparently had partial dialysis  Saturday and presents with dyspnea. Pt notes 3-4 lbs of weight gain since yesterday, and also orthopnea, was also diagnosed with COVID-19 pneumonia and admitted to the hospital for further care.   Subjective:   Patient in bed, appears comfortable, denies any headache, no fever, no chest pain or pressure, no shortness of breath when sitting up but slightly short of breath when he lays flat and has a slight cough, no abdominal pain. No focal weakness.   Assessment  & Plan :     1. Acute Hypoxic Resp. Failure due to Acute Covid 19 Viral Pneumonitis during the ongoing 2020 Covid 19 Pandemic - he has mixed picture with fluid overload from ESRD and COVID-19 pneumonia.  Responding well to IV steroids and remdesivir, dialysis for fluid removal.  Continue present treatment and monitor closely.  Advance activity and titrate down steroids and oxygen.  Encouraged the patient to sit up in chair in the daytime use I-S and flutter valve for pulmonary toiletry and then prone in bed when at night.    SpO2: 95 % O2 Flow Rate (L/min): 2 L/min  Recent Labs  Lab 11/12/19 0001 11/12/19 0331 11/13/19 0919 11/13/19 0925 11/14/19 0448 11/14/19 0449  CRP  --  1.8* 2.0*  --   --  1.4*  DDIMER  --  2.30* 2.07*  --   --  2.21*  FERRITIN  --  136  --   --   --   --    BNP  --  839.5*  --  838.5* 851.0*  --   PROCALCITON  --  0.85 0.91  --   --  0.75  SARSCOV2NAA  POSITIVE*  --   --   --   --   --     Hepatic Function Latest Ref Rng & Units 11/14/2019 11/13/2019 11/12/2019  Total Protein 6.5 - 8.1 g/dL 6.9 6.8 6.9  Albumin 3.5 - 5.0 g/dL 3.3(L) 3.4(L) 3.5  AST 15 - 41 U/L 9(L) 12(L) 14(L)  ALT 0 - 44 U/L 16 19 20   Alk Phosphatase 38 - 126 U/L 65 62 60  Total Bilirubin 0.3 - 1.2 mg/dL 0.7 0.5 0.5  Bilirubin, Direct 0.0 - 0.3 mg/dL - - -     2.  ESRD.  On TTS schedule, nephrology on board, fluid removal through HD.  Has mild orthopnea on 11/14/2019 and will be dialyzed.  3.  Essential hypertension.  Blood pressure high despite being on combination of Norvasc, Coreg and ACE inhibitor.  Still takes Lasix which will be continued.  Will beta-blocker for better control.  Added as needed hydralazine.  4.  Steroid-induced hyperglycemia.  On sliding scale will monitor and adjust.  Lab Results  Component Value Date   HGBA1C 4.9 11/12/2019   CBG (last 3)  Recent Labs    11/13/19 1640 11/13/19 2144 11/14/19 0759  GLUCAP 125* 121* 92     Condition - Fair  Family Communication  :  None  Code Status :  Full  Diet :   Diet Order            Diet renal/carb modified with fluid restriction Diet-HS Snack? Nothing; Fluid restriction: 1200 mL Fluid; Room service appropriate? Yes; Fluid consistency: Thin  Diet effective now               Disposition Plan  : In the hospital for COVID-19 treatment with IV remdesivir  Consults  : Nephrology  Procedures  :  None  PUD Prophylaxis :   DVT Prophylaxis  :   Heparin    Lab Results  Component Value Date   PLT 197 11/14/2019    Inpatient Medications  Scheduled Meds: . amLODipine  10 mg Oral Daily  . carvedilol  12.5 mg Oral BID WC  . Chlorhexidine Gluconate Cloth  6 each Topical Q0600  . dexamethasone (DECADRON) injection  2 mg Intravenous Daily  . furosemide  20 mg Oral Daily  . heparin  7,500  Units Subcutaneous Q8H  . insulin aspart  0-5 Units Subcutaneous QHS  . insulin aspart  0-9 Units Subcutaneous TID WC  . lisinopril  40 mg Oral Daily   Continuous Infusions: . sodium chloride    . sodium chloride    . remdesivir 100 mg in NS 100 mL 100 mg (11/14/19 0956)   PRN Meds:.sodium chloride, sodium chloride, albuterol, alteplase, chlorpheniramine-HYDROcodone, heparin, hydrALAZINE, lidocaine (PF), lidocaine-prilocaine, pentafluoroprop-tetrafluoroeth  Antibiotics  :    Anti-infectives (From admission, onward)   Start     Dose/Rate Route Frequency Ordered Stop   11/13/19 1000  remdesivir 100 mg in sodium chloride 0.9 % 100 mL IVPB     100 mg 200 mL/hr over 30 Minutes Intravenous Daily 11/12/19 0229 11/17/19 0959   11/12/19 0315  cefTRIAXone (ROCEPHIN) 2 g in sodium chloride 0.9 % 100 mL IVPB  Status:  Discontinued     2 g 200 mL/hr over 30 Minutes Intravenous Daily at bedtime 11/12/19 0256 11/12/19 0717   11/12/19 0315  azithromycin (ZITHROMAX) 500 mg in sodium chloride 0.9 % 250 mL IVPB  Status:  Discontinued     500 mg 250 mL/hr over 60 Minutes Intravenous Daily at  bedtime 11/12/19 0256 11/12/19 0717   11/12/19 0300  remdesivir 200 mg in sodium chloride 0.9% 250 mL IVPB     200 mg 580 mL/hr over 30 Minutes Intravenous Once 11/12/19 0229 11/12/19 0521       Time Spent in minutes  30   Lala Lund M.D on 11/14/2019 at 11:30 AM  To page go to [email or web address] - password [facility]  [facility] -  Office  [phone]     See all Orders from today for further details    Objective:   Vitals:   11/13/19 1951 11/14/19 0011 11/14/19 0422 11/14/19 0424  BP: (!) 167/106 (!) 166/112 (!) 169/113   Pulse: 77     Resp: 16 18 (!) 27 17  Temp: 97.7 F (36.5 C) 97.7 F (36.5 C) 97.6 F (36.4 C)   TempSrc: Oral Oral Oral   SpO2:  95% 95%   Weight:      Height:        Wt Readings from Last 3 Encounters:  11/13/19 134.5 kg  08/25/15 128.4 kg  04/17/14 135.4 kg      Intake/Output Summary (Last 24 hours) at 11/14/2019 1130 Last data filed at 11/14/2019 0830 Gross per 24 hour  Intake 340 ml  Output -  Net 340 ml     Physical Exam  Awake Alert, No new F.N deficits, Normal affect [place].AT,PERRAL Supple Neck,No JVD, No cervical lymphadenopathy appriciated.  Symmetrical Chest wall movement, Good air movement bilaterally, few crackles present RRR,No Gallops,Rubs or new Murmurs, No Parasternal Heave +ve B.Sounds, Abd Soft, No tenderness, No organomegaly appriciated, No rebound - guarding or rigidity. No Cyanosis, Clubbing or edema, No new Rash or bruise       Data Review:    CBC  Recent Labs  Lab 11/11/19 2344 11/12/19 0002 11/12/19 0331 11/13/19 0919 11/14/19 0449  WBC 7.6  --  7.9 7.1 7.9  HGB 10.9* 10.9* 11.0* 11.2* 11.4*  HCT 36.8* 32.0* 37.0* 37.8* 38.2*  PLT 177  --  190 212 197  MCV 102.5*  --  100.8* 101.1* 99.5  MCH 30.4  --  30.0 29.9 29.7  MCHC 29.6*  --  29.7* 29.6* 29.8*  RDW 18.3*  --  18.4* 18.7* 18.7*  LYMPHSABS 0.9  --   --  1.1 0.8  MONOABS 0.6  --   --  0.5 0.6  EOSABS 0.1  --   --  0.0 0.0  BASOSABS 0.0  --   --  0.1 0.0    Chemistries   Recent Labs  Lab 11/11/19 2344 11/12/19 0002 11/12/19 0331 11/13/19 0919 11/14/19 0449  NA 140 139 139 141 138  K 4.5 4.3 4.4 4.8 5.5*  CL 106  --  104 105 103  CO2 21*  --  23 21* 21*  GLUCOSE 85  --  95 109* 103*  BUN 60*  --  59* 73* 80*  CREATININE 13.34*  --  13.65* 14.60* 15.57*  CALCIUM 8.6*  --  8.6* 8.7* 8.8*  MG  --   --   --  2.3 2.5*  AST 14*  --  14* 12* 9*  ALT 20  --  20 19 16   ALKPHOS 52  --  60 62 65  BILITOT 0.5  --  0.5 0.5 0.7   ------------------------------------------------------------------------------------------------------------------ No results for input(s): CHOL, HDL, LDLCALC, TRIG, CHOLHDL, LDLDIRECT in the last 72 hours.  Lab Results  Component Value Date   HGBA1C 4.9 11/12/2019    ------------------------------------------------------------------------------------------------------------------ No  results for input(s): TSH, T4TOTAL, T3FREE, THYROIDAB in the last 72 hours.  Invalid input(s): FREET3  Cardiac Enzymes No results for input(s): CKMB, TROPONINI, MYOGLOBIN in the last 168 hours.  Invalid input(s): CK ------------------------------------------------------------------------------------------------------------------    Component Value Date/Time   BNP 851.0 (H) 11/14/2019 0448    Micro Results Recent Results (from the past 240 hour(s))  Respiratory Panel by RT PCR (Flu A&B, Covid) - Nasopharyngeal Swab     Status: Abnormal   Collection Time: 11/12/19 12:01 AM   Specimen: Nasopharyngeal Swab  Result Value Ref Range Status   SARS Coronavirus 2 by RT PCR POSITIVE (A) NEGATIVE Final    Comment: RESULT CALLED TO, READ BACK BY AND VERIFIED WITH: H. MASHBURN,RN 0214 11/12/2019 T. TYSOR (NOTE) SARS-CoV-2 target nucleic acids are DETECTED. SARS-CoV-2 RNA is generally detectable in upper respiratory specimens  during the acute phase of infection. Positive results are indicative of the presence of the identified virus, but do not rule out bacterial infection or co-infection with other pathogens not detected by the test. Clinical correlation with patient history and other diagnostic information is necessary to determine patient infection status. The expected result is Negative. Fact Sheet for Patients:  PinkCheek.be Fact Sheet for Healthcare Providers: GravelBags.it This test is not yet approved or cleared by the [place] FDA and  has been authorized for detection and/or diagnosis of SARS-CoV-2 by FDA under an Emergency Use Authorization (EUA).  This EUA will remain in effect (meaning this test can be use d) for the duration of  the COVID-19 declaration under Section 564(b)(1) of the Act, 21  U.S.C. section 360bbb-3(b)(1), unless the authorization is terminated or revoked sooner.    Influenza A by PCR NEGATIVE NEGATIVE Final   Influenza B by PCR NEGATIVE NEGATIVE Final    Comment: (NOTE) The Xpert Xpress SARS-CoV-2/FLU/RSV assay is intended as an aid in  the diagnosis of influenza from Nasopharyngeal swab specimens and  should not be used as a sole basis for treatment. Nasal washings and  aspirates are unacceptable for Xpert Xpress SARS-CoV-2/FLU/RSV  testing. Fact Sheet for Patients: PinkCheek.be Fact Sheet for Healthcare Providers: GravelBags.it This test is not yet approved or cleared by the [place] FDA and  has been authorized for detection and/or diagnosis of SARS-CoV-2 by  FDA under an Emergency Use Authorization (EUA). This EUA will remain  in effect (meaning this test can be used) for the duration of the  Covid-19 declaration under Section 564(b)(1) of the Act, 21  U.S.C. section 360bbb-3(b)(1), unless the authorization is  terminated or revoked. Performed at [facility], [place] [address], [place], [place] [address]   Culture, blood (routine x 2)     Status: None (Preliminary result)   Collection Time: 11/12/19  3:38 AM   Specimen: BLOOD  Result Value Ref Range Status   Specimen Description BLOOD RIGHT ARM  Final   Special Requests   Final    BOTTLES DRAWN AEROBIC AND ANAEROBIC Blood Culture adequate volume   Culture   Final    NO GROWTH 2 DAYS Performed at [facility], [place] [address] [place], [place] [address]    Report Status PENDING  Incomplete  Culture, blood (routine x 2)     Status: None (Preliminary result)   Collection Time: 11/12/19  3:49 AM   Specimen: BLOOD  Result Value Ref Range Status   Specimen Description BLOOD RIGHT HAND  Final   Special Requests   Final    BOTTLES DRAWN AEROBIC  AND ANAEROBIC Blood Culture adequate volume   Culture   Final    NO GROWTH 2 DAYS  Performed at [facility], [place] [address] [place], [place] [address]    Report Status PENDING  Incomplete    Radiology Reports  DG Chest Portable 1 View  Result Date: 11/11/2019 CLINICAL DATA:  Shortness of breath EXAM: PORTABLE CHEST 1 VIEW COMPARISON:  09/12/2019, 07/16/2019 FINDINGS: Right-sided central venous catheter tip over the proximal right atrium. Cardiomegaly with central vascular congestion. Patchy mid and basilar lung opacities. No pleural effusion or pneumothorax. Stable linear density projecting over the left apex. IMPRESSION: 1. Cardiomegaly with central vascular congestion. 2. Hazy and patchy airspace opacity in the mid and lower lungs, possible pneumonia. Electronically Signed   By: Donavan Foil M.D.   On: 11/11/2019 23:39

## 2019-11-14 NOTE — Progress Notes (Signed)
Patient in dialysis.

## 2019-11-15 LAB — COMPREHENSIVE METABOLIC PANEL WITH GFR
ALT: 17 U/L (ref 0–44)
AST: 11 U/L — ABNORMAL LOW (ref 15–41)
Albumin: 3.4 g/dL — ABNORMAL LOW (ref 3.5–5.0)
Alkaline Phosphatase: 69 U/L (ref 38–126)
Anion gap: 15 (ref 5–15)
BUN: 53 mg/dL — ABNORMAL HIGH (ref 6–20)
CO2: 23 mmol/L (ref 22–32)
Calcium: 8.5 mg/dL — ABNORMAL LOW (ref 8.9–10.3)
Chloride: 101 mmol/L (ref 98–111)
Creatinine, Ser: 11.72 mg/dL — ABNORMAL HIGH (ref 0.61–1.24)
GFR calc Af Amer: 5 mL/min — ABNORMAL LOW
GFR calc non Af Amer: 4 mL/min — ABNORMAL LOW
Glucose, Bld: 95 mg/dL (ref 70–99)
Potassium: 4.1 mmol/L (ref 3.5–5.1)
Sodium: 139 mmol/L (ref 135–145)
Total Bilirubin: 0.7 mg/dL (ref 0.3–1.2)
Total Protein: 6.7 g/dL (ref 6.5–8.1)

## 2019-11-15 LAB — GLUCOSE, CAPILLARY
Glucose-Capillary: 86 mg/dL (ref 70–99)
Glucose-Capillary: 82 mg/dL (ref 70–99)
Glucose-Capillary: 107 mg/dL — ABNORMAL HIGH (ref 70–99)
Glucose-Capillary: 111 mg/dL — ABNORMAL HIGH (ref 70–99)

## 2019-11-15 LAB — CBC WITH DIFFERENTIAL/PLATELET
Abs Immature Granulocytes: 0.03 10*3/uL (ref 0.00–0.07)
Basophils Absolute: 0 10*3/uL (ref 0.0–0.1)
Basophils Relative: 1 %
Eosinophils Absolute: 0.1 10*3/uL (ref 0.0–0.5)
Eosinophils Relative: 1 %
HCT: 38.6 % — ABNORMAL LOW (ref 39.0–52.0)
Hemoglobin: 11.8 g/dL — ABNORMAL LOW (ref 13.0–17.0)
Immature Granulocytes: 0 %
Lymphocytes Relative: 16 %
Lymphs Abs: 1.2 10*3/uL (ref 0.7–4.0)
MCH: 29.8 pg (ref 26.0–34.0)
MCHC: 30.6 g/dL (ref 30.0–36.0)
MCV: 97.5 fL (ref 80.0–100.0)
Monocytes Absolute: 0.6 10*3/uL (ref 0.1–1.0)
Monocytes Relative: 8 %
Neutro Abs: 5.8 10*3/uL (ref 1.7–7.7)
Neutrophils Relative %: 74 %
Platelets: 192 10*3/uL (ref 150–400)
RBC: 3.96 MIL/uL — ABNORMAL LOW (ref 4.22–5.81)
RDW: 18.8 % — ABNORMAL HIGH (ref 11.5–15.5)
WBC: 7.8 10*3/uL (ref 4.0–10.5)
nRBC: 0.4 % — ABNORMAL HIGH (ref 0.0–0.2)

## 2019-11-15 LAB — MAGNESIUM: Magnesium: 2.2 mg/dL (ref 1.7–2.4)

## 2019-11-15 LAB — D-DIMER, QUANTITATIVE: D-Dimer, Quant: 2.42 ug{FEU}/mL — ABNORMAL HIGH (ref 0.00–0.50)

## 2019-11-15 LAB — C-REACTIVE PROTEIN: CRP: 0.9 mg/dL (ref <1.0)

## 2019-11-15 LAB — BRAIN NATRIURETIC PEPTIDE: B Natriuretic Peptide: 709.9 pg/mL — ABNORMAL HIGH (ref 0.0–100.0)

## 2019-11-15 MED ORDER — DEXAMETHASONE SODIUM PHOSPHATE 4 MG/ML IJ SOLN: 1.0000 mg | Freq: Every day | INTRAMUSCULAR | Status: DC

## 2019-11-15 MED ORDER — HYDRALAZINE HCL 50 MG PO TABS
100.0000 mg | ORAL_TABLET | Freq: Three times a day (TID) | ORAL | Status: DC
  Administered 2019-11-15 – 2019-11-16 (×4): 100 mg via ORAL
  Filled 2019-11-15 (×4): qty 2

## 2019-11-15 NOTE — Progress Notes (Signed)
  [facility] Progress Note   Assessment/ Plan:   1COVID infection: remdesivir/ decadron per primary.   2 ESRD: TTS.  HD off schedule yesterday 2/15--> marked improvement today, will dialyze MWF schedule here d/t high volume of TTS COVID + patients.  S/p HD 2/17, next planned 2/19. 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 Metabolic Bone Disease: binders, phoslo 2 TID AC 6.  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  Dispo: pending  Subjective:    Doing better this AM. Had HD yesterday with 3.5L off.     Objective:   BP (!) 180/123   Pulse 71   Temp 98.4 F (36.9 C) (Oral)   Resp 19   Ht 6\' 1"  (1.854 m)   Wt 133.2 kg   SpO2 97%   BMI 38.74 kg/m   Physical Exam: GEN NAD, sitting in chair HEENT EOMI PERRL wearing mask NECK JVD improved PULM on O2, bibasilar R sided crackles improved CV RRR ABD soft EXT 2+ woody edema to the thigh, some slight NEURO AAO x 3 no asterixis ACCESS: R IJ TDC c/d/i  Labs: BMET Recent Labs  Lab 11/11/19 2344 11/12/19 0002 11/12/19 0331 11/13/19 0919 11/14/19 0449 11/15/19 0427  NA 140 139 139 141 138 139  K 4.5 4.3 4.4 4.8 5.5* 4.1  CL 106  --  104 105 103 101  CO2 21*  --  23 21* 21* 23  GLUCOSE 85  --  95 109* 103* 95  BUN 60*  --  59* 73* 80* 53*  CREATININE 13.34*  --  13.65* 14.60* 15.57* 11.72*  CALCIUM 8.6*  --  8.6* 8.7* 8.8* 8.5*  PHOS  --   --   --  8.5*  --   --    CBC Recent Labs  Lab 11/11/19 2344 11/12/19 0002 11/12/19 0331 11/13/19 0919 11/14/19 0449 11/15/19 0427  WBC 7.6   < > 7.9 7.1 7.9 7.8  NEUTROABS 5.9  --   --  5.4 6.4 5.8  HGB 10.9*   < > 11.0* 11.2* 11.4* 11.8*  HCT 36.8*   < > 37.0* 37.8* 38.2* 38.6*  MCV 102.5*   < > 100.8* 101.1* 99.5 97.5  PLT 177   < > 190 212 197 192   < > = values in  this interval not displayed.      Medications:    . amLODipine  10 mg Oral Daily  . calcium acetate  1,334 mg Oral TID WC  . carvedilol  12.5 mg Oral BID WC  . Chlorhexidine Gluconate Cloth  6 each Topical Q0600  . [START ON 11/16/2019] dexamethasone (DECADRON) injection  1 mg Intravenous Daily  . furosemide  20 mg Oral Daily  . heparin  7,500 Units Subcutaneous Q8H  . hydrALAZINE  100 mg Oral Q8H  . insulin aspart  0-5 Units Subcutaneous QHS  . insulin aspart  0-9 Units Subcutaneous TID WC  . lisinopril  40 mg Oral Daily     Madelon Lips MD 11/15/2019, 11:13 AM

## 2019-11-15 NOTE — Progress Notes (Signed)
PROGRESS NOTE                                                                                                                                                                                                             Patient Demographics:    Jake Williams, is a 54 y.o. male, DOB - November 07, 1965, ZOX:[id number]  Outpatient Primary MD for the patient is Rip Harbour    LOS - 3  Admit date - 11/11/2019    Chief Complaint  Patient presents with  . Shortness of Breath       Brief Narrative 54 y.o. male,  w hypertension,  ESRD on HD TTS, apparently had partial dialysis  Saturday and presents with dyspnea. Pt notes 3-4 lbs of weight gain since yesterday, and also orthopnea, was also diagnosed with COVID-19 pneumonia and admitted to the hospital for further care.   Subjective:   Patient in bed, appears comfortable, denies any headache, no fever, no chest pain or pressure, no shortness of breath , no abdominal pain. No focal weakness.   Assessment  & Plan :     1. Acute Hypoxic Resp. Failure due to Acute Covid 19 Viral Pneumonitis during the ongoing 2020 Covid 19 Pandemic - he has mixed picture with fluid overload from ESRD and COVID-19 pneumonia.  Responding well to IV steroids and remdesivir, dialysis for fluid removal.  Continue present treatment and monitor closely.  Advance activity and titrate down steroids and oxygen.  Finish remdesivir course and then discharged home.  Encouraged the patient to sit up in chair in the daytime use I-S and flutter valve for pulmonary toiletry and then prone in bed when at night.    SpO2: 97 % O2 Flow Rate (L/min): 2 L/min  Recent Labs  Lab 11/12/19 0001 11/12/19 0331 11/13/19 0919 11/13/19 0925 11/14/19 0448 11/14/19 0449 11/15/19 0427  CRP  --  1.8* 2.0*  --   --  1.4* 0.9  DDIMER  --  2.30* 2.07*  --   --  2.21* 2.42*  FERRITIN  --  136  --   --   --   --   --   BNP   --  839.5*  --  838.5* 851.0*  --  709.9*  PROCALCITON  --  0.85 0.91  --   --  0.75  --  SARSCOV2NAA POSITIVE*  --   --   --   --   --   --     Hepatic Function Latest Ref Rng & Units 11/15/2019 11/14/2019 11/13/2019  Total Protein 6.5 - 8.1 g/dL 6.7 6.9 6.8  Albumin 3.5 - 5.0 g/dL 3.4(L) 3.3(L) 3.4(L)  AST 15 - 41 U/L 11(L) 9(L) 12(L)  ALT 0 - 44 U/L 17 16 19   Alk Phosphatase 38 - 126 U/L 69 65 62  Total Bilirubin 0.3 - 1.2 mg/dL 0.7 0.7 0.5  Bilirubin, Direct 0.0 - 0.3 mg/dL - - -     2.  ESRD.  On TTS schedule, nephrology on board, fluid removal through HD.  Has mild orthopnea on 11/14/2019 and will be dialyzed.  3.  Essential hypertension.  Blood pressure high despite being on combination of Norvasc, Coreg and ACE inhibitor.  Still takes Lasix which will be continued.  Will beta-blocker for better control.  Added as needed hydralazine.  4.  Steroid-induced hyperglycemia.  On sliding scale will monitor and adjust.  Lab Results  Component Value Date   HGBA1C 4.9 11/12/2019   CBG (last 3)  Recent Labs    11/14/19 1647 11/14/19 2051 11/15/19 0755  GLUCAP 96 156* 86     Condition - Fair  Family Communication  :  None  Code Status :  Full  Diet :   Diet Order            Diet renal/carb modified with fluid restriction Diet-HS Snack? Nothing; Fluid restriction: 1200 mL Fluid; Room service appropriate? Yes; Fluid consistency: Thin  Diet effective now               Disposition Plan  : In the hospital for COVID-19 treatment with IV remdesivir  Consults  : Nephrology  Procedures  :  None  PUD Prophylaxis :   DVT Prophylaxis  :   Heparin    Lab Results  Component Value Date   PLT 192 11/15/2019    Inpatient Medications  Scheduled Meds: . amLODipine  10 mg Oral Daily  . calcium acetate  1,334 mg Oral TID WC  . carvedilol  12.5 mg Oral BID WC  . Chlorhexidine Gluconate Cloth  6 each Topical Q0600  . [START ON 11/16/2019] dexamethasone (DECADRON) injection   1 mg Intravenous Daily  . furosemide  20 mg Oral Daily  . heparin  7,500 Units Subcutaneous Q8H  . hydrALAZINE  100 mg Oral Q8H  . insulin aspart  0-5 Units Subcutaneous QHS  . insulin aspart  0-9 Units Subcutaneous TID WC  . lisinopril  40 mg Oral Daily   Continuous Infusions: . sodium chloride    . sodium chloride    . remdesivir 100 mg in NS 100 mL 100 mg (11/15/19 0915)   PRN Meds:.sodium chloride, sodium chloride, albuterol, alteplase, chlorpheniramine-HYDROcodone, heparin, hydrALAZINE, lidocaine (PF), lidocaine-prilocaine, pentafluoroprop-tetrafluoroeth  Antibiotics  :    Anti-infectives (From admission, onward)   Start     Dose/Rate Route Frequency Ordered Stop   11/13/19 1000  remdesivir 100 mg in sodium chloride 0.9 % 100 mL IVPB     100 mg 200 mL/hr over 30 Minutes Intravenous Daily 11/12/19 0229 11/17/19 0959   11/12/19 0315  cefTRIAXone (ROCEPHIN) 2 g in sodium chloride 0.9 % 100 mL IVPB  Status:  Discontinued     2 g 200 mL/hr over 30 Minutes Intravenous Daily at bedtime 11/12/19 0256 11/12/19 0717   11/12/19 0315  azithromycin (ZITHROMAX) 500 mg in  sodium chloride 0.9 % 250 mL IVPB  Status:  Discontinued     500 mg 250 mL/hr over 60 Minutes Intravenous Daily at bedtime 11/12/19 0256 11/12/19 0717   11/12/19 0300  remdesivir 200 mg in sodium chloride 0.9% 250 mL IVPB     200 mg 580 mL/hr over 30 Minutes Intravenous Once 11/12/19 0229 11/12/19 0521       Time Spent in minutes  30   Lala Lund M.D on 11/15/2019 at 9:49 AM  To page go to [email or web address] - password [facility]  [facility] -  Office  [phone]     See all Orders from today for further details    Objective:   Vitals:   11/15/19 0011 11/15/19 0413 11/15/19 0458 11/15/19 0800  BP: (!) 161/100 (!) 162/94  (!) 194/127  Pulse: 75 77  71  Resp: 18 20  19   Temp: 98.6 F (37 C) 98.5 F (36.9 C)  98.4 F (36.9 C)  TempSrc: Oral Oral  Oral  SpO2: 91% 92%  97%  Weight:   133.2 kg     Height:        Wt Readings from Last 3 Encounters:  11/15/19 133.2 kg  08/25/15 128.4 kg  04/17/14 135.4 kg     Intake/Output Summary (Last 24 hours) at 11/15/2019 0949 Last data filed at 11/15/2019 0915 Gross per 24 hour  Intake 835 ml  Output 3800 ml  Net -2965 ml     Physical Exam  Awake Alert, No new F.N deficits, Normal affect .AT,PERRAL Supple Neck,No JVD, No cervical lymphadenopathy appriciated.  Symmetrical Chest wall movement, Good air movement bilaterally, CTAB RRR,No Gallops, Rubs or new Murmurs, No Parasternal Heave +ve B.Sounds, Abd Soft, No tenderness, No organomegaly appriciated, No rebound - guarding or rigidity. No Cyanosis, Clubbing or edema, No new Rash or bruise      Data Review:    CBC  Recent Labs  Lab 11/11/19 2344 11/11/19 2344 11/12/19 0002 11/12/19 0331 11/13/19 0919 11/14/19 0449 11/15/19 0427  WBC 7.6  --   --  7.9 7.1 7.9 7.8  HGB 10.9*   < > 10.9* 11.0* 11.2* 11.4* 11.8*  HCT 36.8*   < > 32.0* 37.0* 37.8* 38.2* 38.6*  PLT 177  --   --  190 212 197 192  MCV 102.5*  --   --  100.8* 101.1* 99.5 97.5  MCH 30.4  --   --  30.0 29.9 29.7 29.8  MCHC 29.6*  --   --  29.7* 29.6* 29.8* 30.6  RDW 18.3*  --   --  18.4* 18.7* 18.7* 18.8*  LYMPHSABS 0.9  --   --   --  1.1 0.8 1.2  MONOABS 0.6  --   --   --  0.5 0.6 0.6  EOSABS 0.1  --   --   --  0.0 0.0 0.1  BASOSABS 0.0  --   --   --  0.1 0.0 0.0   < > = values in this interval not displayed.    Chemistries   Recent Labs  Lab 11/11/19 2344 11/11/19 2344 11/12/19 0002 11/12/19 0331 11/13/19 0919 11/14/19 0449 11/15/19 0427  NA 140   < > 139 139 141 138 139  K 4.5   < > 4.3 4.4 4.8 5.5* 4.1  CL 106  --   --  104 105 103 101  CO2 21*  --   --  23 21* 21* 23  GLUCOSE 85  --   --  95 109* 103* 95  BUN 60*  --   --  59* 73* 80* 53*  CREATININE 13.34*  --   --  13.65* 14.60* 15.57* 11.72*  CALCIUM 8.6*  --   --  8.6* 8.7* 8.8* 8.5*  MG  --   --   --   --  2.3 2.5* 2.2  AST 14*   --   --  14* 12* 9* 11*  ALT 20  --   --  20 19 16 17   ALKPHOS 52  --   --  60 62 65 69  BILITOT 0.5  --   --  0.5 0.5 0.7 0.7   < > = values in this interval not displayed.   ------------------------------------------------------------------------------------------------------------------ No results for input(s): CHOL, HDL, LDLCALC, TRIG, CHOLHDL, LDLDIRECT in the last 72 hours.  Lab Results  Component Value Date   HGBA1C 4.9 11/12/2019   ------------------------------------------------------------------------------------------------------------------ No results for input(s): TSH, T4TOTAL, T3FREE, THYROIDAB in the last 72 hours.  Invalid input(s): FREET3  Cardiac Enzymes No results for input(s): CKMB, TROPONINI, MYOGLOBIN in the last 168 hours.  Invalid input(s): CK ------------------------------------------------------------------------------------------------------------------    Component Value Date/Time   BNP 709.9 (H) 11/15/2019 5790    Micro Results Recent Results (from the past 240 hour(s))  Respiratory Panel by RT PCR (Flu A&B, Covid) - Nasopharyngeal Swab     Status: Abnormal   Collection Time: 11/12/19 12:01 AM   Specimen: Nasopharyngeal Swab  Result Value Ref Range Status   SARS Coronavirus 2 by RT PCR POSITIVE (A) NEGATIVE Final    Comment: RESULT CALLED TO, READ BACK BY AND VERIFIED WITH: H. MASHBURN,RN 0214 11/12/2019 T. TYSOR (NOTE) SARS-CoV-2 target nucleic acids are DETECTED. SARS-CoV-2 RNA is generally detectable in upper respiratory specimens  during the acute phase of infection. Positive results are indicative of the presence of the identified virus, but do not rule out bacterial infection or co-infection with other pathogens not detected by the test. Clinical correlation with patient history and other diagnostic information is necessary to determine patient infection status. The expected result is Negative. Fact Sheet for Patients:   PinkCheek.be Fact Sheet for Healthcare Providers: GravelBags.it This test is not yet approved or cleared by the [place] FDA and  has been authorized for detection and/or diagnosis of SARS-CoV-2 by FDA under an Emergency Use Authorization (EUA).  This EUA will remain in effect (meaning this test can be use d) for the duration of  the COVID-19 declaration under Section 564(b)(1) of the Act, 21 U.S.C. section 360bbb-3(b)(1), unless the authorization is terminated or revoked sooner.    Influenza A by PCR NEGATIVE NEGATIVE Final   Influenza B by PCR NEGATIVE NEGATIVE Final    Comment: (NOTE) The Xpert Xpress SARS-CoV-2/FLU/RSV assay is intended as an aid in  the diagnosis of influenza from Nasopharyngeal swab specimens and  should not be used as a sole basis for treatment. Nasal washings and  aspirates are unacceptable for Xpert Xpress SARS-CoV-2/FLU/RSV  testing. Fact Sheet for Patients: PinkCheek.be Fact Sheet for Healthcare Providers: GravelBags.it This test is not yet approved or cleared by the [place] FDA and  has been authorized for detection and/or diagnosis of SARS-CoV-2 by  FDA under an Emergency Use Authorization (EUA). This EUA will remain  in effect (meaning this test can be used) for the duration of the  Covid-19 declaration under Section 564(b)(1) of the Act, 21  U.S.C. section 360bbb-3(b)(1), unless the authorization is  terminated or revoked. Performed at [facility], [place] [address] [place], [place] [address]   Culture, blood (routine x 2)     Status: None (Preliminary result)   Collection Time: 11/12/19  3:38 AM   Specimen: BLOOD  Result Value Ref Range Status   Specimen Description BLOOD RIGHT ARM  Final   Special Requests   Final    BOTTLES DRAWN AEROBIC AND ANAEROBIC Blood Culture adequate volume   Culture   Final    NO GROWTH  3 DAYS Performed at [facility], [place] [address] [place], [place] [address]    Report Status PENDING  Incomplete  Culture, blood (routine x 2)     Status: None (Preliminary result)   Collection Time: 11/12/19  3:49 AM   Specimen: BLOOD  Result Value Ref Range Status   Specimen Description BLOOD RIGHT HAND  Final   Special Requests   Final    BOTTLES DRAWN AEROBIC AND ANAEROBIC Blood Culture adequate volume   Culture   Final    NO GROWTH 3 DAYS Performed at [facility], [address], [place], [place] [address]    Report Status PENDING  Incomplete    Radiology Reports  DG Chest Portable 1 View  Result Date: 11/11/2019 CLINICAL DATA:  Shortness of breath EXAM: PORTABLE CHEST 1 VIEW COMPARISON:  09/12/2019, 07/16/2019 FINDINGS: Right-sided central venous catheter tip over the proximal right atrium. Cardiomegaly with central vascular congestion. Patchy mid and basilar lung opacities. No pleural effusion or pneumothorax. Stable linear density projecting over the left apex. IMPRESSION: 1. Cardiomegaly with central vascular congestion. 2. Hazy and patchy airspace opacity in the mid and lower lungs, possible pneumonia. Electronically Signed   By: Donavan Foil M.D.   On: 11/11/2019 23:39

## 2019-11-15 NOTE — Progress Notes (Signed)
Patient alert and oriented x 4 said that he updated already his family and I don't need to call anybody. Will continue to monitor.

## 2019-11-16 LAB — GLUCOSE, CAPILLARY
Glucose-Capillary: 87 mg/dL (ref 70–99)
Glucose-Capillary: 99 mg/dL (ref 70–99)

## 2019-11-16 LAB — CBC WITH DIFFERENTIAL/PLATELET
Abs Immature Granulocytes: 0.02 10*3/uL (ref 0.00–0.07)
Basophils Absolute: 0 10*3/uL (ref 0.0–0.1)
Basophils Relative: 0 %
Eosinophils Absolute: 0.1 10*3/uL (ref 0.0–0.5)
Eosinophils Relative: 1 %
HCT: 39.7 % (ref 39.0–52.0)
Hemoglobin: 12 g/dL — ABNORMAL LOW (ref 13.0–17.0)
Immature Granulocytes: 0 %
Lymphocytes Relative: 14 %
Lymphs Abs: 1.1 10*3/uL (ref 0.7–4.0)
MCH: 29.7 pg (ref 26.0–34.0)
MCHC: 30.2 g/dL (ref 30.0–36.0)
MCV: 98.3 fL (ref 80.0–100.0)
Monocytes Absolute: 0.6 10*3/uL (ref 0.1–1.0)
Monocytes Relative: 9 %
Neutro Abs: 5.6 10*3/uL (ref 1.7–7.7)
Neutrophils Relative %: 76 %
Platelets: 213 10*3/uL (ref 150–400)
RBC: 4.04 MIL/uL — ABNORMAL LOW (ref 4.22–5.81)
RDW: 18.6 % — ABNORMAL HIGH (ref 11.5–15.5)
WBC: 7.4 10*3/uL (ref 4.0–10.5)
nRBC: 0.3 % — ABNORMAL HIGH (ref 0.0–0.2)

## 2019-11-16 LAB — COMPREHENSIVE METABOLIC PANEL
ALT: 14 U/L (ref 0–44)
AST: 10 U/L — ABNORMAL LOW (ref 15–41)
Albumin: 3.4 g/dL — ABNORMAL LOW (ref 3.5–5.0)
Alkaline Phosphatase: 72 U/L (ref 38–126)
Anion gap: 16 — ABNORMAL HIGH (ref 5–15)
BUN: 65 mg/dL — ABNORMAL HIGH (ref 6–20)
CO2: 22 mmol/L (ref 22–32)
Calcium: 9 mg/dL (ref 8.9–10.3)
Chloride: 100 mmol/L (ref 98–111)
Creatinine, Ser: 13.34 mg/dL — ABNORMAL HIGH (ref 0.61–1.24)
GFR calc Af Amer: 4 mL/min — ABNORMAL LOW (ref >60)
GFR calc non Af Amer: 4 mL/min — ABNORMAL LOW (ref >60)
Glucose, Bld: 85 mg/dL (ref 70–99)
Potassium: 4.7 mmol/L (ref 3.5–5.1)
Sodium: 138 mmol/L (ref 135–145)
Total Bilirubin: 0.4 mg/dL (ref 0.3–1.2)
Total Protein: 6.5 g/dL (ref 6.5–8.1)

## 2019-11-16 LAB — D-DIMER, QUANTITATIVE: D-Dimer, Quant: 2.4 ug/mL-FEU — ABNORMAL HIGH (ref 0.00–0.50)

## 2019-11-16 LAB — BRAIN NATRIURETIC PEPTIDE: B Natriuretic Peptide: 668.3 pg/mL — ABNORMAL HIGH (ref 0.0–100.0)

## 2019-11-16 LAB — MAGNESIUM: Magnesium: 2.2 mg/dL (ref 1.7–2.4)

## 2019-11-16 LAB — C-REACTIVE PROTEIN: CRP: 0.9 mg/dL (ref <1.0)

## 2019-11-16 MED ORDER — ISOSORBIDE MONONITRATE ER 60 MG PO TB24: 60.0000 mg | ORAL_TABLET | Freq: Every day | ORAL | 0 refills | Status: AC

## 2019-11-16 MED ORDER — IPRATROPIUM-ALBUTEROL 0.5-2.5 (3) MG/3ML IN SOLN: RESPIRATORY_TRACT | 0 refills | Status: AC

## 2019-11-16 MED ORDER — CARVEDILOL 12.5 MG PO TABS: 12.5000 mg | ORAL_TABLET | Freq: Two times a day (BID) | ORAL | 0 refills | Status: AC

## 2019-11-16 MED ORDER — HEPARIN SODIUM (PORCINE) 1000 UNIT/ML IJ SOLN
INTRAMUSCULAR | Status: AC
  Filled 2019-11-16: qty 4

## 2019-11-16 MED ORDER — HYDRALAZINE HCL 50 MG PO TABS: 50.0000 mg | ORAL_TABLET | Freq: Three times a day (TID) | ORAL | 0 refills | Status: AC

## 2019-11-16 MED ORDER — LISINOPRIL 40 MG PO TABS: 40.0000 mg | ORAL_TABLET | Freq: Every day | ORAL | 0 refills | Status: AC

## 2019-11-16 MED ORDER — ISOSORBIDE MONONITRATE ER 60 MG PO TB24
60.0000 mg | ORAL_TABLET | Freq: Every day | ORAL | Status: DC
  Administered 2019-11-16: 60 mg via ORAL
  Filled 2019-11-16: qty 1

## 2019-11-16 MED ORDER — AMLODIPINE BESYLATE 10 MG PO TABS: 10.0000 mg | ORAL_TABLET | Freq: Every day | ORAL | 0 refills | Status: AC

## 2019-11-16 NOTE — Progress Notes (Signed)
Linden Dolin to be D/C'd Home per MD order.  Discussed with the patient and all questions fully answered.  VSS, Skin clean, dry and intact without evidence of skin break down, no evidence of skin tears noted. IV catheter discontinued intact. Site without signs and symptoms of complications. Dressing and pressure applied.  An After Visit Summary was printed and given to the patient. Patient received prescription.  D/c education completed with patient/family including follow up instructions, medication list, d/c activities limitations if indicated, with other d/c instructions as indicated by MD - patient able to verbalize understanding, all questions fully answered.   Patient instructed to return to ED, call 911, or call MD for any changes in condition.   Patient escorted via [place], and D/C home via private auto.  Patient information given for outpatient HD for home clinic MWF at 5:40 AM .   [place] 11/16/2019 4:23 PM

## 2019-11-16 NOTE — Discharge Summary (Signed)
Linden Dolin [id number] DOB: Nov 22, 1965 DOA: 11/11/2019  PCP: Robert Bellow, PA-C  Admit date: 11/11/2019  Discharge date: 11/16/2019  Admitted From: Home   Disposition:  Home3   Recommendations for Outpatient Follow-up:   Follow up with PCP in 1-2 weeks  PCP Please obtain BMP/CBC, 2 view CXR in 1week,  (see Discharge instructions)   PCP Please follow up on the following pending results:    Home Health: None   Equipment/Devices: has PRN 2lit home o2  Consultations: None  Discharge Condition: Stable    CODE STATUS: Full    Diet Recommendation -renal/low carbohydrate diet with 1.5L/day total fluid restriction    Chief Complaint  Patient presents with  . Shortness of Breath     Brief history of present illness from the day of admission and additional interim summary    54 y.o.male,w hypertension,  ESRD on HD TTS, apparently had partial dialysis Saturday and presents with dyspnea. Pt notes 3-4 lbs of weight gain since yesterday, and also orthopnea, was also diagnosed with COVID-19 pneumonia and admitted to the hospital for further care.                                                                  Hospital Course     1. Acute Hypoxic Resp. Failure due to Acute Covid 19 Viral Pneumonitis during the ongoing 2020 Covid 19 Pandemic - he has mixed picture with fluid overload from ESRD and COVID-19 pneumonia.    Responded well to steroid and the severe treatment for COVID-19 pneumonia, that problem has resolved, fluid overload was addressed by nephrology team via removal through HD.  He uses 2 L nasal cannula oxygen at home as needed and he is on that, otherwise says he is feels as good as his baseline.  Will finish his IV remdesivir course today and with then will be discharged home with outpatient  PCP follow-up, I strongly question his compliance with his dialysis regimen and his blood pressure medications, he was counseled for both.    Recent Labs  Lab 11/12/19 0001 11/12/19 0331 11/13/19 0919 11/13/19 0925 11/14/19 0448 11/14/19 0449 11/15/19 0427 11/16/19 0245  CRP  --  1.8* 2.0*  --   --  1.4* 0.9 0.9  DDIMER  --  2.30* 2.07*  --   --  2.21* 2.42* 2.40*  FERRITIN  --  136  --   --   --   --   --   --   BNP  --  839.5*  --  838.5* 851.0*  --  709.9* 668.3*  PROCALCITON  --  0.85 0.91  --   --  0.75  --   --   SARSCOV2NAA POSITIVE*  --   --   --   --   --   --   --  2.  ESRD.  On TTS schedule, nephrology on board, fluid removal through HD.    Was seen by Dialyzed per schedule.  3.  Essential hypertension.    Compliance with blood pressure medications at home, counseled, placed on multiple blood pressure medications prescriptions provided, PCP to monitor.  4.  Steroid-induced hyperglycemia.  stable.  Lab Results  Component Value Date   HGBA1C 4.9 11/12/2019      Discharge diagnosis     Principal Problem:   Dyspnea Active Problems:   HTN (hypertension)   DM (diabetes mellitus) ([place])   ESRD (end stage renal disease) ([place])   Acute respiratory failure with hypoxia ([place])   COVID-19 virus infection    Discharge instructions    Discharge Instructions    Discharge instructions   Complete by: As directed    Follow with Primary MD Robert Bellow, PA-C in 7 days   Get CBC, CMP, 2 view Chest X ray -  checked next visit within 1 week by Primary MD    Activity: As tolerated with Full fall precautions use walker/cane & assistance as needed  Disposition Home   Diet: Renal / low carb diet, 1.5 L/day total fluid restriction  Special Instructions: If you have smoked or chewed Tobacco  in the last 2 yrs please stop smoking, stop any regular Alcohol  and or any Recreational drug use.  On your next visit with your primary care physician please Get  Medicines reviewed and adjusted.  Please request your Prim.MD to go over all Hospital Tests and Procedure/Radiological results at the follow up, please get all Hospital records sent to your Prim MD by signing hospital release before you go home.  If you experience worsening of your admission symptoms, develop shortness of breath, life threatening emergency, suicidal or homicidal thoughts you must seek medical attention immediately by calling 911 or calling your MD immediately  if symptoms less severe.  You Must read complete instructions/literature along with all the possible adverse reactions/side effects for all the Medicines you take and that have been prescribed to you. Take any new Medicines after you have completely understood and accpet all the possible adverse reactions/side effects.   Increase activity slowly   Complete by: As directed    MyChart COVID-19 home monitoring program   Complete by: Nov 16, 2019    Is the patient willing to use the [place] for home monitoring?: Yes   Temperature monitoring   Complete by: Nov 16, 2019    After how many days would you like to receive a notification of this patient's flowsheet entries?: 1      Discharge Medications   Allergies as of 11/16/2019   No Known Allergies     Medication List    STOP taking these medications   lisinopril-hydrochlorothiazide 20-12.5 MG tablet Commonly known as: Zestoretic   metoprolol tartrate 50 MG tablet Commonly known as: LOPRESSOR     TAKE these medications   amLODipine 10 MG tablet Commonly known as: NORVASC Take 1 tablet (10 mg total) by mouth daily.   carvedilol 12.5 MG tablet Commonly known as: COREG Take 1 tablet (12.5 mg total) by mouth 2 (two) times daily with a meal.   furosemide 20 MG tablet Commonly known as: Lasix Take 1 tablet (20 mg total) by mouth daily.   hydrALAZINE 50 MG tablet Commonly known as: APRESOLINE Take 1 tablet (50 mg total) by mouth every 8 (eight)  hours.   ipratropium-albuterol 0.5-2.5 (3) MG/3ML Soln Commonly known  as: DUONEB Use twice a day scheduled and every 4 hours as needed for shortness of breath and wheezing   isosorbide mononitrate 60 MG 24 hr tablet Commonly known as: IMDUR Take 1 tablet (60 mg total) by mouth daily.   lisinopril 40 MG tablet Commonly known as: ZESTRIL Take 1 tablet (40 mg total) by mouth daily.       Follow-up Information    Robert Bellow, PA-C. Schedule an appointment as soon as possible for a visit in 1 week(s).   Specialty: Internal Medicine Contact information: [phone] [place] [address] [phone]           Major procedures and Radiology Reports - PLEASE review detailed and final reports thoroughly  -        DG Chest Portable 1 View  Result Date: 11/11/2019 CLINICAL DATA:  Shortness of breath EXAM: PORTABLE CHEST 1 VIEW COMPARISON:  09/12/2019, 07/16/2019 FINDINGS: Right-sided central venous catheter tip over the proximal right atrium. Cardiomegaly with central vascular congestion. Patchy mid and basilar lung opacities. No pleural effusion or pneumothorax. Stable linear density projecting over the left apex. IMPRESSION: 1. Cardiomegaly with central vascular congestion. 2. Hazy and patchy airspace opacity in the mid and lower lungs, possible pneumonia. Electronically Signed   By: Donavan Foil M.D.   On: 11/11/2019 23:39    Micro Results     Recent Results (from the past 240 hour(s))  Respiratory Panel by RT PCR (Flu A&B, Covid) - Nasopharyngeal Swab     Status: Abnormal   Collection Time: 11/12/19 12:01 AM   Specimen: Nasopharyngeal Swab  Result Value Ref Range Status   SARS Coronavirus 2 by RT PCR POSITIVE (A) NEGATIVE Final    Comment: RESULT CALLED TO, READ BACK BY AND VERIFIED WITH: H. MASHBURN,RN 0214 11/12/2019 T. TYSOR (NOTE) SARS-CoV-2 target nucleic acids are DETECTED. SARS-CoV-2 RNA is generally detectable in upper respiratory  specimens  during the acute phase of infection. Positive results are indicative of the presence of the identified virus, but do not rule out bacterial infection or co-infection with other pathogens not detected by the test. Clinical correlation with patient history and other diagnostic information is necessary to determine patient infection status. The expected result is Negative. Fact Sheet for Patients:  PinkCheek.be Fact Sheet for Healthcare Providers: GravelBags.it This test is not yet approved or cleared by the [place] FDA and  has been authorized for detection and/or diagnosis of SARS-CoV-2 by FDA under an Emergency Use Authorization (EUA).  This EUA will remain in effect (meaning this test can be use d) for the duration of  the COVID-19 declaration under Section 564(b)(1) of the Act, 21 U.S.C. section 360bbb-3(b)(1), unless the authorization is terminated or revoked sooner.    Influenza A by PCR NEGATIVE NEGATIVE Final   Influenza B by PCR NEGATIVE NEGATIVE Final    Comment: (NOTE) The Xpert Xpress SARS-CoV-2/FLU/RSV assay is intended as an aid in  the diagnosis of influenza from Nasopharyngeal swab specimens and  should not be used as a sole basis for treatment. Nasal washings and  aspirates are unacceptable for Xpert Xpress SARS-CoV-2/FLU/RSV  testing. Fact Sheet for Patients: PinkCheek.be Fact Sheet for Healthcare Providers: GravelBags.it This test is not yet approved or cleared by the [place] FDA and  has been authorized for detection and/or diagnosis of SARS-CoV-2 by  FDA under an Emergency Use Authorization (EUA). This EUA will remain  in effect (meaning this test can be used) for the duration  of the  Covid-19 declaration under Section 564(b)(1) of the Act, 21  U.S.C. section 360bbb-3(b)(1), unless the authorization is  terminated or  revoked. Performed at [facility], [place] [address] [place], [place] [address]   Culture, blood (routine x 2)     Status: None (Preliminary result)   Collection Time: 11/12/19  3:38 AM   Specimen: BLOOD  Result Value Ref Range Status   Specimen Description BLOOD RIGHT ARM  Final   Special Requests   Final    BOTTLES DRAWN AEROBIC AND ANAEROBIC Blood Culture adequate volume   Culture   Final    NO GROWTH 4 DAYS Performed at [facility], [place] [address], [place], [place] [address]    Report Status PENDING  Incomplete  Culture, blood (routine x 2)     Status: None (Preliminary result)   Collection Time: 11/12/19  3:49 AM   Specimen: BLOOD  Result Value Ref Range Status   Specimen Description BLOOD RIGHT HAND  Final   Special Requests   Final    BOTTLES DRAWN AEROBIC AND ANAEROBIC Blood Culture adequate volume   Culture   Final    NO GROWTH 4 DAYS Performed at [facility], [place] [address] [place], [place] [address]    Report Status PENDING  Incomplete    Today   Subjective    Jerriel Murat today has no headache,no chest abdominal pain,no new weakness tingling or numbness, feels much better wants to go home today.    Objective   Blood pressure (!) 175/94, pulse 86, temperature 97.8 F (36.6 C), temperature source Axillary, resp. rate 20, height 6\' 1"  (1.854 m), weight 133.2 kg, SpO2 97 %.   Intake/Output Summary (Last 24 hours) at 11/16/2019 0958 Last data filed at 11/15/2019 2000 Gross per 24 hour  Intake 380 ml  Output --  Net 380 ml    Exam Awake Alert, Oriented x 3, No new F.N deficits, Normal affect [place].AT,PERRAL Supple Neck,No JVD, No cervical lymphadenopathy appriciated.  Symmetrical Chest wall movement, Good air movement bilaterally, CTAB RRR,No Gallops,Rubs or new Murmurs, No Parasternal Heave +ve B.Sounds, Abd Soft, Non tender, No organomegaly appriciated, No rebound -guarding or rigidity. No Cyanosis, Clubbing or edema, No new Rash or  bruise   Data Review   CBC w Diff:  Lab Results  Component Value Date   WBC 7.4 11/16/2019   HGB 12.0 (L) 11/16/2019   HCT 39.7 11/16/2019   PLT 213 11/16/2019   LYMPHOPCT 14 11/16/2019   MONOPCT 9 11/16/2019   EOSPCT 1 11/16/2019   BASOPCT 0 11/16/2019    CMP:  Lab Results  Component Value Date   NA 138 11/16/2019   K 4.7 11/16/2019   CL 100 11/16/2019   CO2 22 11/16/2019   BUN 65 (H) 11/16/2019   CREATININE 13.34 (H) 11/16/2019   CREATININE 1.25 04/17/2014   PROT 6.5 11/16/2019   ALBUMIN 3.4 (L) 11/16/2019   BILITOT 0.4 11/16/2019   ALKPHOS 72 11/16/2019   AST 10 (L) 11/16/2019   ALT 14 11/16/2019  .   Total Time in preparing paper work, data evaluation and todays exam - 21 minutes  Lala Lund M.D on 11/16/2019 at 9:58 AM  [facility]   Office  [phone]

## 2019-11-16 NOTE — Progress Notes (Signed)
Renal Navigator received call back from [place] at patient's OP HD clinic/[facility]. In order to treat in isolation at discharge due to [place] positive status, patient will treat at his home clinic on a MWF schedule with a 6:00am seat time. He needs to arrive at the clinic at 5:40am.  Renal Navigator attempted to call patient's cell phone listed in Epic, but this number has been disconnected. Renal Navigator attempted to call in to patient's room, but there was no answer. Navigator spoke with patient's RN, who states patient is at HD now. Navigator gave message to RN regarding OP HD schedule and asked to give to patient when he returns from HD. He agreed. Navigator asked RN to tell patient to call his clinic directly if he has any questions. Patient is cleared for discharge from an OP HD standpoint.  Alphonzo Cruise, [facility] Renal Navigator [phone]

## 2019-11-16 NOTE — Care Management (Signed)
Pt has been deemed stable for discharge home today.  CM reviewed chart for TOC needs/consults/orders - none found.  CM reached out to renal coordinator and informed of discharge order - CM requested bedside nurse speak with coordinator prior to discharge to ensure that covid positive oupt HD has been arranged.  No TOC needs determined - discharge order signed - CM signing off

## 2019-11-16 NOTE — Care Management Important Message (Signed)
Important Message  Patient Details  Name: Jake Williams MRN: [id number] Date of Birth: 08-Feb-1966   Medicare Important Message Given:  Yes - Important Message mailed due to current National Emergency  Verbal consent obtained due to current National Emergency  Relationship to patient: Self Contact Name: Kobe [place]bee Call Date: 11/16/19  Time: 1032 Phone: [phone] Outcome: No Answer/Busy Important Message mailed to: Patient address on file    Delorse Lek 11/16/2019, 10:32 AM

## 2019-11-16 NOTE — Discharge Instructions (Signed)
Follow with Primary MD Robert Bellow, PA-C in 7 days   Get CBC, CMP, 2 view Chest X ray -  checked next visit within 1 week by Primary MD    Activity: As tolerated with Full fall precautions use walker/cane & assistance as needed  Disposition Home   Diet: Renal / low carb diet, 1.5 L/day total fluid restriction  Special Instructions: If you have smoked or chewed Tobacco  in the last 2 yrs please stop smoking, stop any regular Alcohol  and or any Recreational drug use.  On your next visit with your primary care physician please Get Medicines reviewed and adjusted.  Please request your Prim.MD to go over all Hospital Tests and Procedure/Radiological results at the follow up, please get all Hospital records sent to your Prim MD by signing hospital release before you go home.  If you experience worsening of your admission symptoms, develop shortness of breath, life threatening emergency, suicidal or homicidal thoughts you must seek medical attention immediately by calling 911 or calling your MD immediately  if symptoms less severe.  You Must read complete instructions/literature along with all the possible adverse reactions/side effects for all the Medicines you take and that have been prescribed to you. Take any new Medicines after you have completely understood and accpet all the possible adverse reactions/side effects.       Person Under Monitoring Name: Jake Williams  Location: [place] [address]   Infection Prevention Recommendations for Individuals Confirmed to have, or Being Evaluated for, 2019 Novel Coronavirus (COVID-19) Infection Who Receive Care at Home  Individuals who are confirmed to have, or are being evaluated for, COVID-19 should follow the prevention steps below until a healthcare provider or local or state health department says they can return to normal activities.  Stay home except to get medical care You should restrict activities outside  your home, except for getting medical care. Do not go to work, school, or public areas, and do not use public transportation or taxis.  Call ahead before visiting your doctor Before your medical appointment, call the healthcare provider and tell them that you have, or are being evaluated for, COVID-19 infection. This will help the healthcare provider's office take steps to keep other people from getting infected. Ask your healthcare provider to call the local or state health department.  Monitor your symptoms Seek prompt medical attention if your illness is worsening (e.g., difficulty breathing). Before going to your medical appointment, call the healthcare provider and tell them that you have, or are being evaluated for, COVID-19 infection. Ask your healthcare provider to call the local or state health department.  Wear a facemask You should wear a facemask that covers your nose and mouth when you are in the same room with other people and when you visit a healthcare provider. People who live with or visit you should also wear a facemask while they are in the same room with you.  Separate yourself from other people in your home As much as possible, you should stay in a different room from other people in your home. Also, you should use a separate bathroom, if available.  Avoid sharing household items You should not share dishes, drinking glasses, cups, eating utensils, towels, bedding, or other items with other people in your home. After using these items, you should wash them thoroughly with soap and water.  Cover your coughs and sneezes Cover your mouth and nose with a tissue when you cough or  sneeze, or you can cough or sneeze into your sleeve. Throw used tissues in a lined trash can, and immediately wash your hands with soap and water for at least 20 seconds or use an alcohol-based hand rub.  Wash your [facility] your hands often and thoroughly with soap and water for at least 20  seconds. You can use an alcohol-based hand sanitizer if soap and water are not available and if your hands are not visibly dirty. Avoid touching your eyes, nose, and mouth with unwashed hands.   Prevention Steps for Caregivers and Household Members of Individuals Confirmed to have, or Being Evaluated for, COVID-19 Infection Being Cared for in the Home  If you live with, or provide care at home for, a person confirmed to have, or being evaluated for, COVID-19 infection please follow these guidelines to prevent infection:  Follow healthcare provider's instructions Make sure that you understand and can help the patient follow any healthcare provider instructions for all care.  Provide for the patient's basic needs You should help the patient with basic needs in the home and provide support for getting groceries, prescriptions, and other personal needs.  Monitor the patient's symptoms If they are getting sicker, call his or her medical provider and tell them that the patient has, or is being evaluated for, COVID-19 infection. This will help the healthcare provider's office take steps to keep other people from getting infected. Ask the healthcare provider to call the local or state health department.  Limit the number of people who have contact with the patient  If possible, have only one caregiver for the patient.  Other household members should stay in another home or place of residence. If this is not possible, they should stay  in another room, or be separated from the patient as much as possible. Use a separate bathroom, if available.  Restrict visitors who do not have an essential need to be in the home.  Keep older adults, very young children, and other sick people away from the patient Keep older adults, very young children, and those who have compromised immune systems or chronic health conditions away from the patient. This includes people with chronic heart, lung, or kidney  conditions, diabetes, and cancer.  Ensure good ventilation Make sure that shared spaces in the home have good air flow, such as from an air conditioner or an opened window, weather permitting.  Wash your hands often  Wash your hands often and thoroughly with soap and water for at least 20 seconds. You can use an alcohol based hand sanitizer if soap and water are not available and if your hands are not visibly dirty.  Avoid touching your eyes, nose, and mouth with unwashed hands.  Use disposable paper towels to dry your hands. If not available, use dedicated cloth towels and replace them when they become wet.  Wear a facemask and gloves  Wear a disposable facemask at all times in the room and gloves when you touch or have contact with the patient's blood, body fluids, and/or secretions or excretions, such as sweat, saliva, sputum, nasal mucus, vomit, urine, or feces.  Ensure the mask fits over your nose and mouth tightly, and do not touch it during use.  Throw out disposable facemasks and gloves after using them. Do not reuse.  Wash your hands immediately after removing your facemask and gloves.  If your personal clothing becomes contaminated, carefully remove clothing and launder. Wash your hands after handling contaminated clothing.  Place all used  disposable facemasks, gloves, and other waste in a lined container before disposing them with other household waste.  Remove gloves and wash your hands immediately after handling these items.  Do not share dishes, glasses, or other household items with the patient  Avoid sharing household items. You should not share dishes, drinking glasses, cups, eating utensils, towels, bedding, or other items with a patient who is confirmed to have, or being evaluated for, COVID-19 infection.  After the person uses these items, you should wash them thoroughly with soap and water.  Wash laundry thoroughly  Immediately remove and wash clothes or  bedding that have blood, body fluids, and/or secretions or excretions, such as sweat, saliva, sputum, nasal mucus, vomit, urine, or feces, on them.  Wear gloves when handling laundry from the patient.  Read and follow directions on labels of laundry or clothing items and detergent. In general, wash and dry with the warmest temperatures recommended on the label.  Clean all areas the individual has used often  Clean all touchable surfaces, such as counters, tabletops, doorknobs, bathroom fixtures, toilets, phones, keyboards, tablets, and bedside tables, every day. Also, clean any surfaces that may have blood, body fluids, and/or secretions or excretions on them.  Wear gloves when cleaning surfaces the patient has come in contact with.  Use a diluted bleach solution (e.g., dilute bleach with 1 part bleach and 10 parts water) or a household disinfectant with a label that says EPA-registered for coronaviruses. To make a bleach solution at home, add 1 tablespoon of bleach to 1 quart (4 cups) of water. For a larger supply, add  cup of bleach to 1 gallon (16 cups) of water.  Read labels of cleaning products and follow recommendations provided on product labels. Labels contain instructions for safe and effective use of the cleaning product including precautions you should take when applying the product, such as wearing gloves or eye protection and making sure you have good ventilation during use of the product.  Remove gloves and wash hands immediately after cleaning.  Monitor yourself for signs and symptoms of illness Caregivers and household members are considered close contacts, should monitor their health, and will be asked to limit movement outside of the home to the extent possible. Follow the monitoring steps for close contacts listed on the symptom monitoring form.   ? If you have additional questions, contact your local health department or call the epidemiologist on call at  [phone] (available 24/7). ? This guidance is subject to change. For the most up-to-date guidance from [facility], please refer to their website: YouBlogs.pl

## 2019-11-16 NOTE — Progress Notes (Signed)
Renal Navigator appreciates notification by CM that patient is scheduled for discharge today. Navigator has previously been in touch with Clinic Manager at patient's OP HD clinic to notify her that patient was here being treated for COVID pneumonia. Navigator reached out again today to inform Clinic Manager of patient's discharge today. Awaiting a returned call now to confirm OP HD plan. Renal Navigator will follow closely and then follow up with patient.  Alphonzo Cruise, [place] Renal Navigator [phone]

## 2019-11-16 NOTE — Progress Notes (Signed)
  [facility] Progress Note   Assessment/ Plan:   1COVID infection: remdesivir/ decadron per primary.   2 ESRD: TTS.  HD off schedule yesterday 2/15--> marked improvement today, will dialyze MWF schedule here d/t high volume of TTS COVID + patients.  S/p HD 2/17, next planned today 2/19. 3 Hypertension: improving but still hypertensive to the 160s--> hopefully will improve s/p UF goal  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 Metabolic Bone Disease: binders, phoslo 2 TID AC 6.  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  Dispo: pending  Subjective:   NO complaints.  On HD.  Going for 4L UF   Objective:   BP (!) 170/89   Pulse 80   Temp 97.8 F (36.6 C) (Axillary)   Resp 18   Ht 6\' 1"  (1.854 m)   Wt 133.2 kg   SpO2 97%   BMI 38.74 kg/m   Physical Exam: GEN NAD, sitting in chair HEENT EOMI PERRL wearing mask NECK JVD improved PULM on O2, bibasilar R sided crackles improved CV RRR ABD soft EXT 2+ woody edema to the thigh, some improvement NEURO AAO x 3 no asterixis ACCESS: R IJ TDC c/d/i  Labs: BMET Recent Labs  Lab 11/11/19 2344 11/12/19 0002 11/12/19 0331 11/13/19 0919 11/14/19 0449 11/15/19 0427 11/16/19 0245  NA 140 139 139 141 138 139 138  K 4.5 4.3 4.4 4.8 5.5* 4.1 4.7  CL 106  --  104 105 103 101 100  CO2 21*  --  23 21* 21* 23 22  GLUCOSE 85  --  95 109* 103* 95 85  BUN 60*  --  59* 73* 80* 53* 65*  CREATININE 13.34*  --  13.65* 14.60* 15.57* 11.72* 13.34*  CALCIUM 8.6*  --  8.6* 8.7* 8.8* 8.5* 9.0  PHOS  --   --   --  8.5*  --   --   --    CBC Recent Labs  Lab 11/13/19 0919 11/14/19 0449 11/15/19 0427 11/16/19 0245  WBC 7.1 7.9 7.8 7.4  NEUTROABS 5.4 6.4 5.8 5.6  HGB 11.2* 11.4* 11.8* 12.0*  HCT 37.8* 38.2* 38.6* 39.7  MCV 101.1* 99.5 97.5 98.3  PLT 212 197 192 213      Medications:    . heparin      . amLODipine  10 mg Oral Daily  . calcium acetate  1,334 mg Oral TID WC  .  carvedilol  12.5 mg Oral BID WC  . Chlorhexidine Gluconate Cloth  6 each Topical Q0600  . furosemide  20 mg Oral Daily  . heparin  7,500 Units Subcutaneous Q8H  . hydrALAZINE  100 mg Oral Q8H  . insulin aspart  0-5 Units Subcutaneous QHS  . insulin aspart  0-9 Units Subcutaneous TID WC  . isosorbide mononitrate  60 mg Oral Daily  . lisinopril  40 mg Oral Daily     Madelon Lips MD 11/16/2019, 12:20 PM

## 2019-11-17 LAB — CULTURE, BLOOD (ROUTINE X 2)
Culture: NO GROWTH
Culture: NO GROWTH
Special Requests: ADEQUATE
Special Requests: ADEQUATE

## 2020-08-24 IMAGING — DX DG CHEST 1V PORT
1 series · 1 of 1 positions shown · non-contrast
Comparison: 09/12/2019, 07/16/2019

CLINICAL DATA: Shortness of breath

EXAM:
PORTABLE CHEST 1 VIEW

[chest]
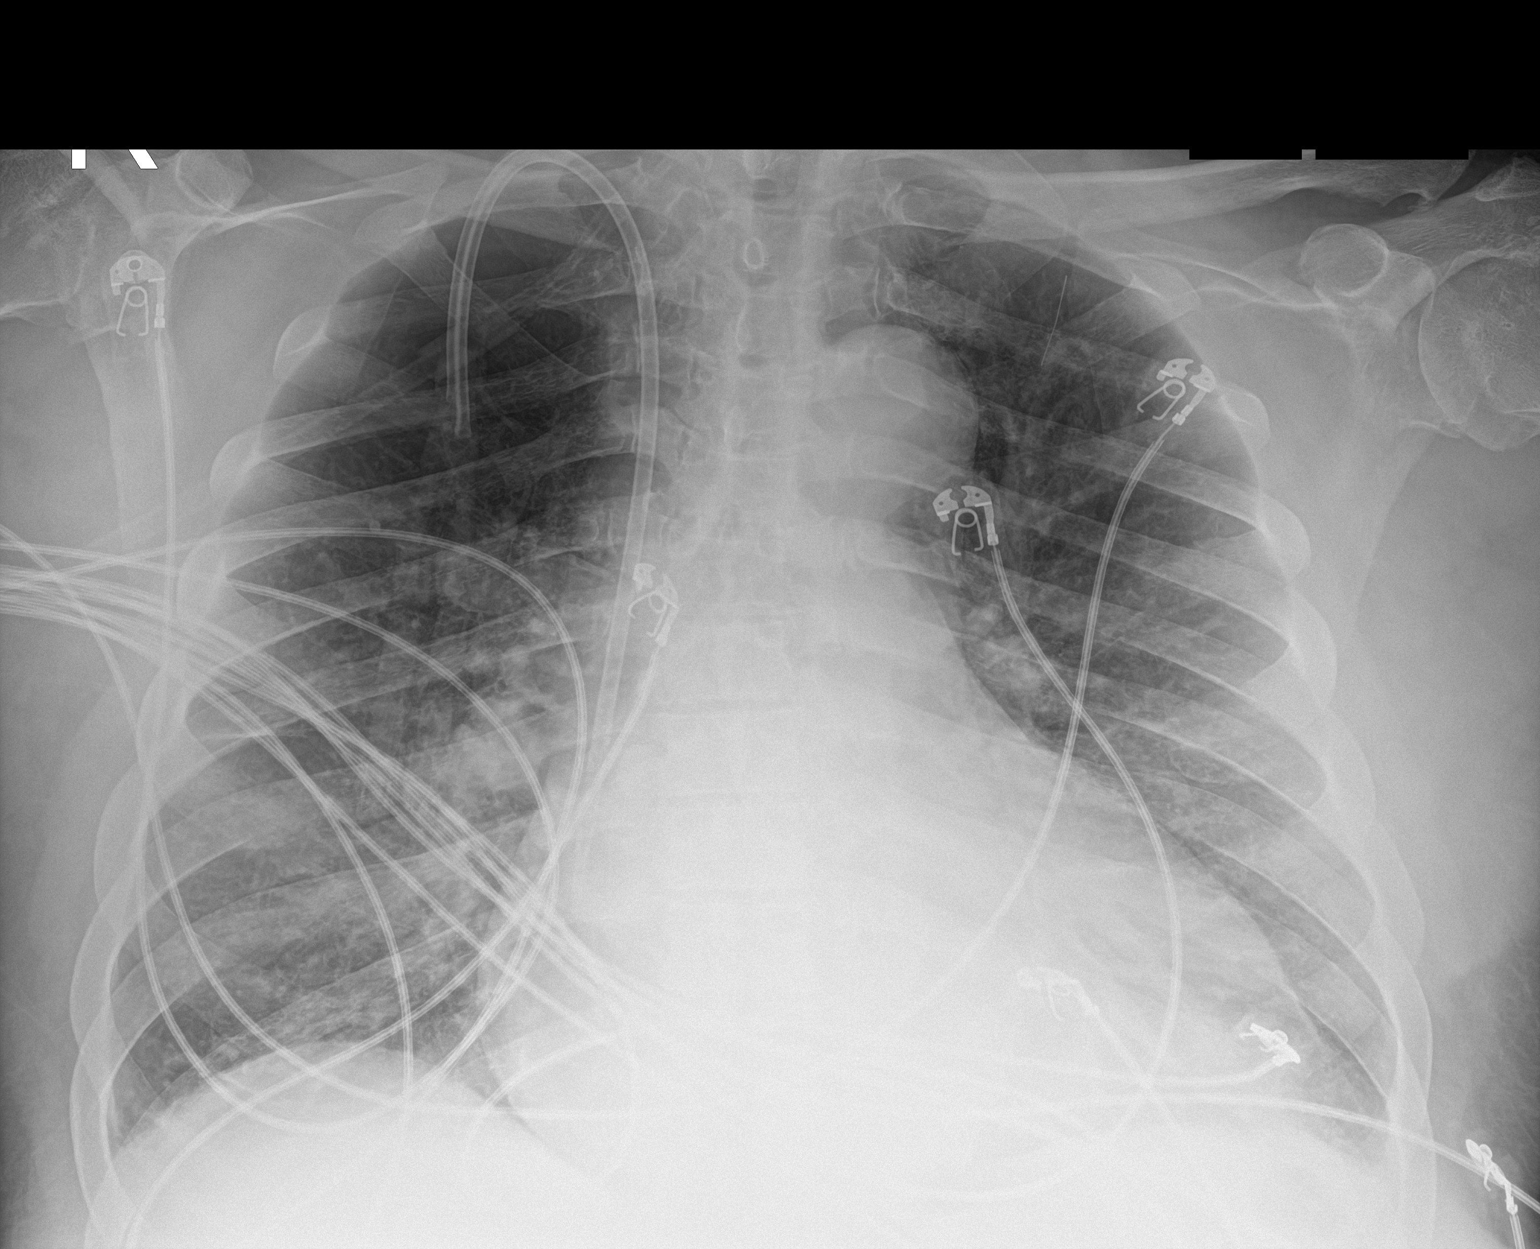

[1 of 1 positions shown; findings below may reference images not displayed]

FINDINGS: Right-sided central venous catheter tip over the proximal right
atrium. Cardiomegaly with central vascular congestion. Patchy mid
and basilar lung opacities. No pleural effusion or pneumothorax.
Stable linear density projecting over the left apex.
IMPRESSION: 1. Cardiomegaly with central vascular congestion.
2. Hazy and patchy airspace opacity in the mid and lower lungs,
possible pneumonia.

## 2021-08-17 ENCOUNTER — Institutional Professional Consult (permissible substitution)
Admission: AD | Admit: 2021-08-17 | Discharge: 2021-09-08 | Disposition: A | Discharge status: Rehabilitation Facility | Payer: 59 | Source: Other Acute Inpatient Hospital

## 2021-08-17 ENCOUNTER — Other Ambulatory Visit (HOSPITAL_COMMUNITY): Payer: Self-pay

## 2021-08-17 DIAGNOSIS — G9341 Metabolic encephalopathy: Secondary | ICD-10-CM

## 2021-08-17 DIAGNOSIS — N186 End stage renal disease: Secondary | ICD-10-CM

## 2021-08-17 DIAGNOSIS — J449 Chronic obstructive pulmonary disease, unspecified: Secondary | ICD-10-CM

## 2021-08-17 DIAGNOSIS — J9621 Acute and chronic respiratory failure with hypoxia: Secondary | ICD-10-CM

## 2021-08-17 DIAGNOSIS — J9690 Respiratory failure, unspecified, unspecified whether with hypoxia or hypercapnia: Secondary | ICD-10-CM

## 2021-08-17 DIAGNOSIS — G40909 Epilepsy, unspecified, not intractable, without status epilepticus: Secondary | ICD-10-CM

## 2021-08-17 DIAGNOSIS — Z931 Gastrostomy status: Secondary | ICD-10-CM

## 2021-08-17 MED ORDER — DIATRIZOATE MEGLUMINE & SODIUM 66-10 % PO SOLN
ORAL | Status: AC
  Administered 2021-08-17: 30 mL via GASTROSTOMY
  Filled 2021-08-17: qty 30

## 2021-08-18 DIAGNOSIS — G9341 Metabolic encephalopathy: Secondary | ICD-10-CM | POA: Diagnosis not present

## 2021-08-18 DIAGNOSIS — G40909 Epilepsy, unspecified, not intractable, without status epilepticus: Secondary | ICD-10-CM

## 2021-08-18 DIAGNOSIS — J9621 Acute and chronic respiratory failure with hypoxia: Secondary | ICD-10-CM | POA: Diagnosis not present

## 2021-08-18 DIAGNOSIS — Z992 Dependence on renal dialysis: Secondary | ICD-10-CM

## 2021-08-18 DIAGNOSIS — J449 Chronic obstructive pulmonary disease, unspecified: Secondary | ICD-10-CM | POA: Diagnosis not present

## 2021-08-18 DIAGNOSIS — N186 End stage renal disease: Secondary | ICD-10-CM | POA: Diagnosis not present

## 2021-08-18 LAB — COMPREHENSIVE METABOLIC PANEL
ALT: 24 U/L (ref 0–44)
AST: 25 U/L (ref 15–41)
Albumin: 2.3 g/dL — ABNORMAL LOW (ref 3.5–5.0)
Alkaline Phosphatase: 144 U/L — ABNORMAL HIGH (ref 38–126)
Anion gap: 9 (ref 5–15)
BUN: 26 mg/dL — ABNORMAL HIGH (ref 6–20)
CO2: 26 mmol/L (ref 22–32)
Calcium: 9.7 mg/dL (ref 8.9–10.3)
Chloride: 98 mmol/L (ref 98–111)
Creatinine, Ser: 3.34 mg/dL — ABNORMAL HIGH (ref 0.61–1.24)
GFR, Estimated: 21 mL/min — ABNORMAL LOW (ref >60)
Glucose, Bld: 103 mg/dL — ABNORMAL HIGH (ref 70–99)
Potassium: 4 mmol/L (ref 3.5–5.1)
Sodium: 133 mmol/L — ABNORMAL LOW (ref 135–145)
Total Bilirubin: 0.3 mg/dL (ref 0.3–1.2)
Total Protein: 6.4 g/dL — ABNORMAL LOW (ref 6.5–8.1)

## 2021-08-18 LAB — CBC
HCT: 27.8 % — ABNORMAL LOW (ref 39.0–52.0)
Hemoglobin: 8.8 g/dL — ABNORMAL LOW (ref 13.0–17.0)
MCH: 29.8 pg (ref 26.0–34.0)
MCHC: 31.7 g/dL (ref 30.0–36.0)
MCV: 94.2 fL (ref 80.0–100.0)
Platelets: 215 10*3/uL (ref 150–400)
RBC: 2.95 MIL/uL — ABNORMAL LOW (ref 4.22–5.81)
RDW: 18.6 % — ABNORMAL HIGH (ref 11.5–15.5)
WBC: 6 10*3/uL (ref 4.0–10.5)
nRBC: 0 % (ref 0.0–0.2)

## 2021-08-18 NOTE — Consult Note (Signed)
Pulmonary Critical Care Medicine [place]  PULMONARY SERVICE  Date of Service: 08/18/2021  PULMONARY CRITICAL CARE CONSULT   Jake Williams  ZSW:[id number]  DOB: [birth year]   DOA: 08/17/2021  Referring Physician: Satira Sark, MD  HPI: Jake Williams is a 55 y.o. male seen for follow up of Acute on Chronic Respiratory Failure.  Patient has multiple medical problems including history of diabetes hypertension seizure disorder cocaine abuse IPH IVH shunting came into the hospital after a seizure which was apparently witnessed by the family.  Patient came into the emergency department was loaded with Keppra and placed on LTM.  Propofol was also used for sedation.  Subsequently was transferred to the neuro ICU had prolonged protracted course and then was not able to come off of the ventilator ultimately ended up having to have a tracheostomy.  Transferred to our facility now for further evaluation.  Patient is here for weaning currently is on T collar and has a cuffless trach in place  Review of Systems:  ROS performed and is unremarkable other than noted above.  Past Medical History:  Diagnosis Date   Cerebral aneurysm 07/27/2011   DM (diabetes mellitus) ([place]) 07/27/2011   HTN (hypertension) 07/27/2011    Past Surgical History:  Procedure Laterality Date   CEREBRAL ANEURYSM REPAIR      Social History:    reports that he has been smoking. He has been smoking an average of .25 packs per day. He has never used smokeless tobacco. He reports current alcohol use of about 12.0 standard drinks per week. He reports that he does not use drugs.  Family History: Non-Contributory to the present illness  No Known Allergies  Medications: Reviewed on Rounds  Physical Exam:  Vitals: The patient's temperature was 96.3 pulse 56 respiratory 15 blood pressure is 128/71 saturations 100%  Ventilator Settings off the ventilator on T collar  General: Comfortable at  this time Eyes: Grossly normal lids, irises & conjunctiva ENT: grossly tongue is normal Neck: no obvious mass Cardiovascular: S1-S2 normal no gallop or rub Respiratory: Coarse breath sounds few rhonchi Abdomen: Soft and nontender Skin: no rash seen on limited exam Musculoskeletal: not rigid Psychiatric:unable to assess Neurologic: no seizure no involuntary movements         Labs on Admission:  Basic Metabolic Panel: Recent Labs  Lab 08/18/21 0257  NA 133*  K 4.0  CL 98  CO2 26  GLUCOSE 103*  BUN 26*  CREATININE 3.34*  CALCIUM 9.7    No results for input(s): PHART, PCO2ART, PO2ART, HCO3, O2SAT in the last 168 hours.  Liver Function Tests: Recent Labs  Lab 08/18/21 0257  AST 25  ALT 24  ALKPHOS 144*  BILITOT 0.3  PROT 6.4*  ALBUMIN 2.3*   No results for input(s): LIPASE, AMYLASE in the last 168 hours. No results for input(s): AMMONIA in the last 168 hours.  CBC: Recent Labs  Lab 08/18/21 0257  WBC 6.0  HGB 8.8*  HCT 27.8*  MCV 94.2  PLT 215    Cardiac Enzymes: No results for input(s): CKTOTAL, CKMB, CKMBINDEX, TROPONINI in the last 168 hours.  BNP (last 3 results) No results for input(s): BNP in the last 8760 hours.  ProBNP (last 3 results) No results for input(s): PROBNP in the last 8760 hours.   Radiological Exams on Admission: DG CHEST PORT 1 VIEW  Result Date: 08/17/2021 CLINICAL DATA:  Respiratory failure.  Gastrostomy tube placement. EXAM: PORTABLE CHEST 1 VIEW COMPARISON:  Chest radiograph 07/30/2021  FINDINGS: Tracheostomy tube tip at the thoracic inlet. Right-sided dialysis catheter tip overlies the SVC. Patient is rotated. The heart is enlarged, increased from prior exam. Stable mediastinal contours. Emphysema with paucity of lung markings at the right lung apex. Catheter tubing is seen projecting over the right and central chest, difficult to delineate distal portion. Increased retrocardiac opacity. Bronchial thickening. No pneumothorax.  Slight blunting of left costophrenic angle. IMPRESSION: 1. Tracheostomy tube tip at the thoracic inlet. Right-sided dialysis catheter with tip over the SVC. 2. Increased retrocardiac opacity, atelectasis versus pneumonia. Possible small left pleural effusion. 3. Emphysema. Electronically Signed   By: Keith Rake M.D.   On: 08/17/2021 22:17   DG Abd Portable 1V  Result Date: 08/17/2021 CLINICAL DATA:  Gastrostomy tube placement. EXAM: PORTABLE ABDOMEN - 1 VIEW COMPARISON:  None. FINDINGS: Single frontal view of the upper abdomen obtained after the installation of 30 cc Gastrografin through indwelling gastrostomy tube. Contrast opacifies the gastrostomy tubing and portions of the stomach. There is no evidence of extravasation or leak. Distal stomach and duodenum are not included in the field of view. IVC filter and aortic stent graft are partially included. IMPRESSION: Gastrostomy tube in the stomach. No evidence of extravasation or leak. Electronically Signed   By: Keith Rake M.D.   On: 08/17/2021 22:18    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 [facility])   Acute on chronic respiratory failure with hypoxia (HCC)   COPD, severe (HCC)   Seizure disorder (HCC)   Acute metabolic encephalopathy   Acute on chronic respiratory failure with hypoxia patient is on T collar plan is going to be to continue with the T collar and try with PMV patient's secretions are moderate we will continue with secretion management supportive care. End-stage renal failure on hemodialysis patient will be seen by nephrology we will continue with supportive care. Severe COPD we will continue with oxygen therapy and supportive care nebulizers as deemed necessary we will continue to monitor. Seizure disorder right now there are no sign of active seizures Metabolic encephalopathy slow to improve likely was related to the seizures she is going on initially  I have personally seen and  evaluated the patient, evaluated laboratory and imaging results, formulated the assessment and plan and placed orders. The Patient requires high complexity decision making with multiple systems involvement.  Case was discussed on Rounds with the Respiratory Therapy Director and the Respiratory staff Time Spent 63minutes  Ayerim Berquist A Brindy Higginbotham, MD [facility] Pulmonary Critical Care Medicine Sleep Medicine

## 2021-08-19 DIAGNOSIS — J9621 Acute and chronic respiratory failure with hypoxia: Secondary | ICD-10-CM | POA: Diagnosis not present

## 2021-08-19 DIAGNOSIS — J449 Chronic obstructive pulmonary disease, unspecified: Secondary | ICD-10-CM | POA: Diagnosis not present

## 2021-08-19 DIAGNOSIS — G9341 Metabolic encephalopathy: Secondary | ICD-10-CM | POA: Diagnosis not present

## 2021-08-19 DIAGNOSIS — N186 End stage renal disease: Secondary | ICD-10-CM | POA: Diagnosis not present

## 2021-08-19 NOTE — Consult Note (Signed)
[facility] CONSULT NOTE    Date: 08/19/2021                  Patient Name:  Jake Williams  MRN: [id number]  DOB: 1966-01-03  Age / Sex: 55 y.o., male         PCP: Benito Mccreedy, MD                 Service Requesting Consult: Hospitalist                 Reason for Consult: Evaluation management of ESRD            History of Present Illness: Patient is a 55 y.o. 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  The patient currently has a right IJ PermCath in place.  He was previously at [facility].  Patient unable to offer any history at the moment given tracheostomy placement but is able to nod yes/no to some questions.  He has underlying hypertension is maintained on multiple antihypertensives including amlodipine, clonidine, labetalol, and lisinopril.  He is also on Renvela for phosphorus control at the moment.   Medications: Outpatient medications: Medications Prior to Admission  Medication Sig Dispense Refill Last Dose   amLODipine (NORVASC) 10 MG tablet Take 1 tablet (10 mg total) by mouth daily. 30 tablet 0    carvedilol (COREG) 12.5 MG tablet Take 1 tablet (12.5 mg total) by mouth 2 (two) times daily with a meal. 60 tablet 0    furosemide (LASIX) 20 MG tablet Take 1 tablet (20 mg total) by mouth daily. 30 tablet 0    hydrALAZINE (APRESOLINE) 50 MG tablet Take 1 tablet (50 mg total) by mouth every 8 (eight) hours. 90 tablet 0    ipratropium-albuterol (DUONEB) 0.5-2.5 (3) MG/3ML SOLN Use twice a day scheduled and every 4 hours as  needed for shortness of breath and wheezing 360 mL 0    isosorbide mononitrate (IMDUR) 60 MG 24 hr tablet Take 1 tablet (60 mg total) by mouth daily. 30 tablet 0    lisinopril (ZESTRIL) 40 MG tablet Take 1 tablet (40 mg total) by mouth daily. 30 tablet 0     Current medications: Amlodipine 10 mg daily, budesonide 0.5 mg inhaled twice daily, clonazepam 1 mg nightly, clonidine 0.3 mg twice daily, famotidine 20 mg every 48 hours, ferrous sulfate 300 mg daily, heparin 5000 units twice daily, labetalol 300 mg twice daily, Keppra 1000 mg nightly, lisinopril 40 mg daily, renal vitamin 1 tablet daily, Renvela 0.8 g 3 times daily   Allergies: No Known Allergies    Past Medical History: Past Medical History:  Diagnosis Date   Cerebral aneurysm 07/27/2011   DM (diabetes mellitus) ([place]) 07/27/2011   HTN (hypertension) 07/27/2011  ESRD, hypertension, 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, history of cocaine abuse, history of status epilepticus with seizure disorder, anemia of chronic kidney disease, secondary hyperparathyroidism, respiratory failure status post tracheostomy placement, right IJ PermCath, hypertension,   Past Surgical History: Past Surgical History:  Procedure Laterality Date   CEREBRAL ANEURYSM REPAIR       Family History: Family History  Problem Relation  Age of Onset   Coronary artery disease Father    Heart disease Father    Cerebral aneurysm Sister    Hypertension Sister    Colon cancer Neg Hx    Prostate cancer Neg Hx      Social History: Social History   Socioeconomic History   Marital status: Single    Spouse name: Not on file   Number of children: Not on file   Years of education: Not on file   Highest education level: Not on file  Occupational History   Not on file  Tobacco Use   Smoking status: Every Day    Packs/day: 0.25    Types: Cigarettes   Smokeless tobacco: Never  Vaping Use   Vaping Use: Never used  Substance and Sexual Activity   Alcohol  use: Yes    Alcohol/week: 12.0 standard drinks    Types: 12 Cans of beer per week   Drug use: No   Sexual activity: Yes  Other Topics Concern   Not on file  Social History Narrative   Not on file   Social Determinants of Health   Financial Resource Strain: Not on file  Food Insecurity: Not on file  Transportation Needs: Not on file  Physical Activity: Not on file  Stress: Not on file  Social Connections: Not on file  Intimate Partner Violence: Not on file     Review of Systems: Patient unable to provide review of systems as he has a tracheostomy in place  Vital Signs: Temperature 97.1 pulse 57 respiration 16 blood pressure 137/72  Weight trends: There were no vitals filed for this visit.   Physical Exam: General: No acute distress  Head: Normocephalic, atraumatic. Moist oral mucosal membranes  Eyes: Anicteric  Neck: Supple  Lungs:  Scattered rhonchi, normal effort  Heart: S1S2 no rubs  Abdomen:  Soft, nontender, bowel sounds present  Extremities: Trace peripheral edema.  Neurologic: Arousable, will follow simple commands  Skin: No acute rash  Access: Right IJ PermCath    Lab results: Basic Metabolic Panel: Recent Labs  Lab 08/18/21 0257  NA 133*  K 4.0  CL 98  CO2 26  GLUCOSE 103*  BUN 26*  CREATININE 3.34*  CALCIUM 9.7    Liver Function Tests: Recent Labs  Lab 08/18/21 0257  AST 25  ALT 24  ALKPHOS 144*  BILITOT 0.3  PROT 6.4*  ALBUMIN 2.3*   No results for input(s): LIPASE, AMYLASE in the last 168 hours. No results for input(s): AMMONIA in the last 168 hours.  CBC: Recent Labs  Lab 08/18/21 0257  WBC 6.0  HGB 8.8*  HCT 27.8*  MCV 94.2  PLT 215    Cardiac Enzymes: No results for input(s): CKTOTAL, CKMB, CKMBINDEX, TROPONINI in the last 168 hours.  BNP: Invalid input(s): POCBNP  CBG: No results for input(s): GLUCAP in the last 168 hours.  Microbiology: Results for orders placed or performed during the hospital encounter  of 11/11/19  Respiratory Panel by RT PCR (Flu A&B, Covid) - Nasopharyngeal Swab     Status: Abnormal   Collection Time: 11/12/19 12:01 AM   Specimen: Nasopharyngeal Swab  Result Value Ref Range Status   SARS Coronavirus 2 by RT PCR POSITIVE (A) NEGATIVE Final    Comment: RESULT CALLED TO, READ BACK BY AND VERIFIED WITH: H. MASHBURN,RN 0214 11/12/2019 T. TYSOR (NOTE) SARS-CoV-2 target nucleic acids are DETECTED. SARS-CoV-2 RNA is generally detectable in upper respiratory specimens  during the acute phase of infection. Positive  results are indicative of the presence of the identified virus, but do not rule out bacterial infection or co-infection with other pathogens not detected by the test. Clinical correlation with patient history and other diagnostic information is necessary to determine patient infection status. The expected result is Negative. Fact Sheet for Patients:  PinkCheek.be Fact Sheet for Healthcare Providers: GravelBags.it This test is not yet approved or cleared by the [place] FDA and  has been authorized for detection and/or diagnosis of SARS-CoV-2 by FDA under an Emergency Use Authorization (EUA).  This EUA will remain in effect (meaning this test can be use d) for the duration of  the COVID-19 declaration under Section 564(b)(1) of the Act, 21 U.S.C. section 360bbb-3(b)(1), unless the authorization is terminated or revoked sooner.    Influenza A by PCR NEGATIVE NEGATIVE Final   Influenza B by PCR NEGATIVE NEGATIVE Final    Comment: (NOTE) The Xpert Xpress SARS-CoV-2/FLU/RSV assay is intended as an aid in  the diagnosis of influenza from Nasopharyngeal swab specimens and  should not be used as a sole basis for treatment. Nasal washings and  aspirates are unacceptable for Xpert Xpress SARS-CoV-2/FLU/RSV  testing. Fact Sheet for Patients: PinkCheek.be Fact Sheet for  Healthcare Providers: GravelBags.it This test is not yet approved or cleared by the [place] FDA and  has been authorized for detection and/or diagnosis of SARS-CoV-2 by  FDA under an Emergency Use Authorization (EUA). This EUA will remain  in effect (meaning this test can be used) for the duration of the  Covid-19 declaration under Section 564(b)(1) of the Act, 21  U.S.C. section 360bbb-3(b)(1), unless the authorization is  terminated or revoked. Performed at [facility], [place] [address] [place], [place] [address]   Culture, blood (routine x 2)     Status: None   Collection Time: 11/12/19  3:38 AM   Specimen: BLOOD  Result Value Ref Range Status   Specimen Description BLOOD RIGHT ARM  Final   Special Requests   Final    BOTTLES DRAWN AEROBIC AND ANAEROBIC Blood Culture adequate volume Performed at [facility], [place] [address], [place], [place] [address]    Culture NO GROWTH 5 DAYS  Final   Report Status 11/17/2019 FINAL  Final  Culture, blood (routine x 2)     Status: None   Collection Time: 11/12/19  3:49 AM   Specimen: BLOOD  Result Value Ref Range Status   Specimen Description BLOOD RIGHT HAND  Final   Special Requests   Final    BOTTLES DRAWN AEROBIC AND ANAEROBIC Blood Culture adequate volume Performed at [facility], [place] [address], [place], [place] [address]    Culture NO GROWTH 5 DAYS  Final   Report Status 11/17/2019 FINAL  Final    Coagulation Studies: No results for input(s): LABPROT, INR in the last 72 hours.  Urinalysis: No results for input(s): COLORURINE, LABSPEC, PHURINE, GLUCOSEU, HGBUR, BILIRUBINUR, KETONESUR, PROTEINUR, UROBILINOGEN, NITRITE, LEUKOCYTESUR in the last 72 hours.  Invalid input(s): APPERANCEUR    Imaging: DG CHEST PORT 1 VIEW  Result Date: 08/17/2021 CLINICAL DATA:  Respiratory failure.  Gastrostomy tube placement. EXAM: PORTABLE CHEST 1 VIEW COMPARISON:  Chest radiograph 07/30/2021  FINDINGS: Tracheostomy tube tip at the thoracic inlet. Right-sided dialysis catheter tip overlies the SVC. Patient is rotated. The heart is enlarged, increased from prior exam. Stable mediastinal contours. Emphysema with paucity of lung markings at the right lung apex. Catheter tubing is seen projecting over the  right and central chest, difficult to delineate distal portion. Increased retrocardiac opacity. Bronchial thickening. No pneumothorax. Slight blunting of left costophrenic angle. IMPRESSION: 1. Tracheostomy tube tip at the thoracic inlet. Right-sided dialysis catheter with tip over the SVC. 2. Increased retrocardiac opacity, atelectasis versus pneumonia. Possible small left pleural effusion. 3. Emphysema. Electronically Signed   By: Keith Rake M.D.   On: 08/17/2021 22:17   DG Abd Portable 1V  Result Date: 08/17/2021 CLINICAL DATA:  Gastrostomy tube placement. EXAM: PORTABLE ABDOMEN - 1 VIEW COMPARISON:  None. FINDINGS: Single frontal view of the upper abdomen obtained after the installation of 30 cc Gastrografin through indwelling gastrostomy tube. Contrast opacifies the gastrostomy tubing and portions of the stomach. There is no evidence of extravasation or leak. Distal stomach and duodenum are not included in the field of view. IVC filter and aortic stent graft are partially included. IMPRESSION: Gastrostomy tube in the stomach. No evidence of extravasation or leak. Electronically Signed   By: Keith Rake M.D.   On: 08/17/2021 22:18     Assessment & Plan: Pt is a 55 y.o. 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  1.  ESRD on HD.  Patient was on HD schedule on TTS at the outside facility.  Nursing has informed me that they do have a spot available for him to undergo dialysis treatment tomorrow.  Therefore we will schedule this and prepare orders.  We will use his right IJ PermCath.  2.  Anemia of chronic kidney disease.  Hemoglobin currently 8.8.  Start the patient on Retacrit 6000 IV with dialysis treatments.  3.  Secondary hyperparathyroidism.  Monitor bone metabolism parameters over the course of the hospitalization and maintain the patient on Renvela for now.  4.  Acute respiratory failure.  Patient has a tracheostomy in place and appears to be functional.  Hopefully over the course of the hospitalization he can be decannulated.  Defer further management to pulmonary/critical care.

## 2021-08-19 NOTE — Progress Notes (Signed)
Pulmonary [place]   PULMONARY CRITICAL CARE SERVICE  PROGRESS NOTE     Jake Williams  HAL:[id number]  DOB: 05/08/1966   DOA: 08/17/2021  Referring Physician: Satira Sark, MD  HPI: Jake Williams is a 55 y.o. male being followed for ventilator/airway/oxygen weaning Acute on Chronic Respiratory Failure.  Patient is on T collar on 28% FiO2 should be doing capping today  Medications: Reviewed on Rounds  Physical Exam:  Vitals: Temperature is 97.1 pulse 57 respiratory rate 16 blood pressure is 137/72 saturations 100%  Ventilator Settings on T collar FiO2 28%  General: Comfortable at this time Neck: supple Cardiovascular: no malignant arrhythmias Respiratory: No rhonchi very coarse breath sounds Skin: no rash seen on limited exam Musculoskeletal: No gross abnormality Psychiatric:unable to assess Neurologic:no involuntary movements         Lab Data:   Basic Metabolic Panel: Recent Labs  Lab 08/18/21 0257  NA 133*  K 4.0  CL 98  CO2 26  GLUCOSE 103*  BUN 26*  CREATININE 3.34*  CALCIUM 9.7    ABG: No results for input(s): PHART, PCO2ART, PO2ART, HCO3, O2SAT in the last 168 hours.  Liver Function Tests: Recent Labs  Lab 08/18/21 0257  AST 25  ALT 24  ALKPHOS 144*  BILITOT 0.3  PROT 6.4*  ALBUMIN 2.3*   No results for input(s): LIPASE, AMYLASE in the last 168 hours. No results for input(s): AMMONIA in the last 168 hours.  CBC: Recent Labs  Lab 08/18/21 0257  WBC 6.0  HGB 8.8*  HCT 27.8*  MCV 94.2  PLT 215    Cardiac Enzymes: No results for input(s): CKTOTAL, CKMB, CKMBINDEX, TROPONINI in the last 168 hours.  BNP (last 3 results) No results for input(s): BNP in the last 8760 hours.  ProBNP (last 3 results) No results for input(s): PROBNP in the last 8760 hours.  Radiological Exams: DG CHEST PORT 1 VIEW  Result Date: 08/17/2021 CLINICAL DATA:  Respiratory failure.  Gastrostomy tube  placement. EXAM: PORTABLE CHEST 1 VIEW COMPARISON:  Chest radiograph 07/30/2021 FINDINGS: Tracheostomy tube tip at the thoracic inlet. Right-sided dialysis catheter tip overlies the SVC. Patient is rotated. The heart is enlarged, increased from prior exam. Stable mediastinal contours. Emphysema with paucity of lung markings at the right lung apex. Catheter tubing is seen projecting over the right and central chest, difficult to delineate distal portion. Increased retrocardiac opacity. Bronchial thickening. No pneumothorax. Slight blunting of left costophrenic angle. IMPRESSION: 1. Tracheostomy tube tip at the thoracic inlet. Right-sided dialysis catheter with tip over the SVC. 2. Increased retrocardiac opacity, atelectasis versus pneumonia. Possible small left pleural effusion. 3. Emphysema. Electronically Signed   By: Keith Rake M.D.   On: 08/17/2021 22:17   DG Abd Portable 1V  Result Date: 08/17/2021 CLINICAL DATA:  Gastrostomy tube placement. EXAM: PORTABLE ABDOMEN - 1 VIEW COMPARISON:  None. FINDINGS: Single frontal view of the upper abdomen obtained after the installation of 30 cc Gastrografin through indwelling gastrostomy tube. Contrast opacifies the gastrostomy tubing and portions of the stomach. There is no evidence of extravasation or leak. Distal stomach and duodenum are not included in the field of view. IVC filter and aortic stent graft are partially included. IMPRESSION: Gastrostomy tube in the stomach. No evidence of extravasation or leak. Electronically Signed   By: Keith Rake M.D.   On: 08/17/2021 22:18    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 [facility])   Acute on chronic  respiratory failure with hypoxia (HCC)   COPD, severe (HCC)   Seizure disorder (HCC)   Acute metabolic encephalopathy   Acute on chronic respiratory failure with hypoxia the plan is to proceed with capping as tolerated continue with secretion management pulmonary  toilet End-stage renal disease basically patient is dialysis dependent nephrology following Severe COPD medical management Seizure disorder no signs of active seizures Metabolic encephalopathy no change we will continue to follow along closely   I have personally seen and evaluated the patient, evaluated laboratory and imaging results, formulated the assessment and plan and placed orders. The Patient requires high complexity decision making with multiple systems involvement.  Rounds were done with the Respiratory Therapy Director and Staff therapists and discussed with nursing staff also.  Allyne Gee, MD [facility] Pulmonary Critical Care Medicine Sleep Medicine

## 2021-08-20 LAB — RENAL FUNCTION PANEL
Albumin: 2.4 g/dL — ABNORMAL LOW (ref 3.5–5.0)
Anion gap: 8 (ref 5–15)
BUN: 54 mg/dL — ABNORMAL HIGH (ref 6–20)
CO2: 26 mmol/L (ref 22–32)
Calcium: 10 mg/dL (ref 8.9–10.3)
Chloride: 97 mmol/L — ABNORMAL LOW (ref 98–111)
Creatinine, Ser: 6.13 mg/dL — ABNORMAL HIGH (ref 0.61–1.24)
GFR, Estimated: 10 mL/min — ABNORMAL LOW (ref >60)
Glucose, Bld: 104 mg/dL — ABNORMAL HIGH (ref 70–99)
Phosphorus: 5.3 mg/dL — ABNORMAL HIGH (ref 2.5–4.6)
Potassium: 4.8 mmol/L (ref 3.5–5.1)
Sodium: 131 mmol/L — ABNORMAL LOW (ref 135–145)

## 2021-08-20 LAB — CBC
HCT: 28.6 % — ABNORMAL LOW (ref 39.0–52.0)
Hemoglobin: 9.3 g/dL — ABNORMAL LOW (ref 13.0–17.0)
MCH: 29.9 pg (ref 26.0–34.0)
MCHC: 32.5 g/dL (ref 30.0–36.0)
MCV: 92 fL (ref 80.0–100.0)
Platelets: 201 10*3/uL (ref 150–400)
RBC: 3.11 MIL/uL — ABNORMAL LOW (ref 4.22–5.81)
RDW: 18.3 % — ABNORMAL HIGH (ref 11.5–15.5)
WBC: 5.9 10*3/uL (ref 4.0–10.5)
nRBC: 0 % (ref 0.0–0.2)

## 2021-08-20 LAB — HEPATITIS B SURFACE ANTIBODY, QUANTITATIVE: Hep B S AB Quant (Post): <3.1 m[IU]/mL — ABNORMAL LOW (ref >9.9)

## 2021-08-20 LAB — HEPATITIS B SURFACE ANTIGEN: Hepatitis B Surface Ag: REACTIVE — AB

## 2021-08-21 NOTE — Progress Notes (Signed)
[facility]  ROUNDING NOTE   Subjective:  Patient seen and evaluated at bedside. Patient's family members at bedside as well. Tracheostomy now capped. Patient able to verbalize.   Objective:  Vital signs in last 24 hours:  Temperature 97 pulse 59 respirations 15 blood pressure 156/94   Physical Exam: General: No acute distress  Head: Normocephalic, atraumatic. Moist oral mucosal membranes  Eyes: Anicteric  Neck: Tracheostomy capped  Lungs:  Scattered rhonchi, normal effort  Heart: S1S2 no rubs  Abdomen:  Soft, nontender, bowel sounds present  Extremities: Trace peripheral edema.  Neurologic: Awake, alert, following commands  Skin: No acute rash  Access: Right IJ PermCath    Basic Metabolic Panel: Recent Labs  Lab 08/18/21 0257 08/20/21 0734  NA 133* 131*  K 4.0 4.8  CL 98 97*  CO2 26 26  GLUCOSE 103* 104*  BUN 26* 54*  CREATININE 3.34* 6.13*  CALCIUM 9.7 10.0  PHOS  --  5.3*    Liver Function Tests: Recent Labs  Lab 08/18/21 0257 08/20/21 0734  AST 25  --   ALT 24  --   ALKPHOS 144*  --   BILITOT 0.3  --   PROT 6.4*  --   ALBUMIN 2.3* 2.4*   No results for input(s): LIPASE, AMYLASE in the last 168 hours. No results for input(s): AMMONIA in the last 168 hours.  CBC: Recent Labs  Lab 08/18/21 0257 08/20/21 0734  WBC 6.0 5.9  HGB 8.8* 9.3*  HCT 27.8* 28.6*  MCV 94.2 92.0  PLT 215 201    Cardiac Enzymes: No results for input(s): CKTOTAL, CKMB, CKMBINDEX, TROPONINI in the last 168 hours.  BNP: Invalid input(s): POCBNP  CBG: No results for input(s): GLUCAP in the last 168 hours.  Microbiology: Results for orders placed or performed during the hospital encounter of 11/11/19  Respiratory Panel by RT PCR (Flu A&B, Covid) - Nasopharyngeal Swab     Status: Abnormal   Collection Time: 11/12/19 12:01 AM   Specimen: Nasopharyngeal Swab  Result Value Ref Range Status   SARS Coronavirus 2 by RT PCR POSITIVE (A) NEGATIVE Final     Comment: RESULT CALLED TO, READ BACK BY AND VERIFIED WITH: H. MASHBURN,RN 0214 11/12/2019 T. TYSOR (NOTE) SARS-CoV-2 target nucleic acids are DETECTED. SARS-CoV-2 RNA is generally detectable in upper respiratory specimens  during the acute phase of infection. Positive results are indicative of the presence of the identified virus, but do not rule out bacterial infection or co-infection with other pathogens not detected by the test. Clinical correlation with patient history and other diagnostic information is necessary to determine patient infection status. The expected result is Negative. Fact Sheet for Patients:  PinkCheek.be Fact Sheet for Healthcare Providers: GravelBags.it This test is not yet approved or cleared by the [place] FDA and  has been authorized for detection and/or diagnosis of SARS-CoV-2 by FDA under an Emergency Use Authorization (EUA).  This EUA will remain in effect (meaning this test can be use d) for the duration of  the COVID-19 declaration under Section 564(b)(1) of the Act, 21 U.S.C. section 360bbb-3(b)(1), unless the authorization is terminated or revoked sooner.    Influenza A by PCR NEGATIVE NEGATIVE Final   Influenza B by PCR NEGATIVE NEGATIVE Final    Comment: (NOTE) The Xpert Xpress SARS-CoV-2/FLU/RSV assay is intended as an aid in  the diagnosis of influenza from Nasopharyngeal swab specimens and  should not be used as a sole basis for treatment. Nasal washings and  aspirates are unacceptable for Xpert Xpress SARS-CoV-2/FLU/RSV  testing. Fact Sheet for Patients: PinkCheek.be Fact Sheet for Healthcare Providers: GravelBags.it This test is not yet approved or cleared by the [place] FDA and  has been authorized for detection and/or diagnosis of SARS-CoV-2 by  FDA under an Emergency Use Authorization (EUA). This EUA will remain   in effect (meaning this test can be used) for the duration of the  Covid-19 declaration under Section 564(b)(1) of the Act, 21  U.S.C. section 360bbb-3(b)(1), unless the authorization is  terminated or revoked. Performed at [facility], [place] [address], [place], [place] [address]   Culture, blood (routine x 2)     Status: None   Collection Time: 11/12/19  3:38 AM   Specimen: BLOOD  Result Value Ref Range Status   Specimen Description BLOOD RIGHT ARM  Final   Special Requests   Final    BOTTLES DRAWN AEROBIC AND ANAEROBIC Blood Culture adequate volume Performed at [facility], [place] [address] [place], [place] [address]    Culture NO GROWTH 5 DAYS  Final   Report Status 11/17/2019 FINAL  Final  Culture, blood (routine x 2)     Status: None   Collection Time: 11/12/19  3:49 AM   Specimen: BLOOD  Result Value Ref Range Status   Specimen Description BLOOD RIGHT HAND  Final   Special Requests   Final    BOTTLES DRAWN AEROBIC AND ANAEROBIC Blood Culture adequate volume Performed at [facility], [place] [address], [place], [place] [address]    Culture NO GROWTH 5 DAYS  Final   Report Status 11/17/2019 FINAL  Final    Coagulation Studies: No results for input(s): LABPROT, INR in the last 72 hours.  Urinalysis: No results for input(s): COLORURINE, LABSPEC, PHURINE, GLUCOSEU, HGBUR, BILIRUBINUR, KETONESUR, PROTEINUR, UROBILINOGEN, NITRITE, LEUKOCYTESUR in the last 72 hours.  Invalid input(s): APPERANCEUR    Imaging: No results found.   Medications:       Assessment/ Plan:  55 y.o. 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   1.  ESRD on HD.  Patient will be due for hemodialysis treatment again tomorrow.  Orders have been prepared.  2.  Anemia of chronic kidney disease.  Hemoglobin 9.3.  Start the patient on Retacrit 6000 units IV with dialysis.  3.  Secondary hyperparathyroidism.  Most recent serum phosphorus is 5.3 and acceptable.  Maintain the patient on Renvela.  4.  Acute respiratory failure.  Tracheostomy in place and has been capped.  Patient able to verbalize.  It appears that he is made good progress.   LOS: 0 Evertt Chouinard 11/25/202210:41 AM

## 2021-08-22 DIAGNOSIS — J9621 Acute and chronic respiratory failure with hypoxia: Secondary | ICD-10-CM | POA: Diagnosis not present

## 2021-08-22 DIAGNOSIS — G9341 Metabolic encephalopathy: Secondary | ICD-10-CM | POA: Diagnosis not present

## 2021-08-22 DIAGNOSIS — J449 Chronic obstructive pulmonary disease, unspecified: Secondary | ICD-10-CM | POA: Diagnosis not present

## 2021-08-22 DIAGNOSIS — N186 End stage renal disease: Secondary | ICD-10-CM | POA: Diagnosis not present

## 2021-08-22 LAB — RENAL FUNCTION PANEL
Albumin: 2.5 g/dL — ABNORMAL LOW (ref 3.5–5.0)
Anion gap: 6 (ref 5–15)
BUN: 27 mg/dL — ABNORMAL HIGH (ref 6–20)
CO2: 29 mmol/L (ref 22–32)
Calcium: 9.3 mg/dL (ref 8.9–10.3)
Chloride: 96 mmol/L — ABNORMAL LOW (ref 98–111)
Creatinine, Ser: 3.37 mg/dL — ABNORMAL HIGH (ref 0.61–1.24)
GFR, Estimated: 21 mL/min — ABNORMAL LOW (ref >60)
Glucose, Bld: 111 mg/dL — ABNORMAL HIGH (ref 70–99)
Phosphorus: 3.3 mg/dL (ref 2.5–4.6)
Potassium: 3.7 mmol/L (ref 3.5–5.1)
Sodium: 131 mmol/L — ABNORMAL LOW (ref 135–145)

## 2021-08-22 LAB — CBC
HCT: 27.3 % — ABNORMAL LOW (ref 39.0–52.0)
Hemoglobin: 8.9 g/dL — ABNORMAL LOW (ref 13.0–17.0)
MCH: 30.1 pg (ref 26.0–34.0)
MCHC: 32.6 g/dL (ref 30.0–36.0)
MCV: 92.2 fL (ref 80.0–100.0)
Platelets: 179 10*3/uL (ref 150–400)
RBC: 2.96 MIL/uL — ABNORMAL LOW (ref 4.22–5.81)
RDW: 18 % — ABNORMAL HIGH (ref 11.5–15.5)
WBC: 5.2 10*3/uL (ref 4.0–10.5)
nRBC: 0 % (ref 0.0–0.2)

## 2021-08-22 NOTE — Progress Notes (Signed)
Pulmonary [place]   PULMONARY CRITICAL CARE SERVICE  PROGRESS NOTE     Jake Williams  LNL:[id number]  DOB: [birth year]   DOA: 08/17/2021  Referring Physician: Satira Sark, MD  HPI: Jake Williams is a 55 y.o. male being followed for ventilator/airway/oxygen weaning Acute on Chronic Respiratory Failure.  Patient is capping on 3 L secretions are still reportedly thick  Medications: Reviewed on Rounds  Physical Exam:  Vitals: Temperature is 97.6 pulse 60 respiratory rate is 13 blood pressure is 164/88 saturations 99%  Ventilator Settings capping on 3 L oxygen  General: Comfortable at this time Neck: supple Cardiovascular: no malignant arrhythmias Respiratory: No rhonchi very coarse breath sounds Skin: no rash seen on limited exam Musculoskeletal: No gross abnormality Psychiatric:unable to assess Neurologic:no involuntary movements         Lab Data:   Basic Metabolic Panel: Recent Labs  Lab 08/18/21 0257 08/20/21 0734 08/22/21 0810  NA 133* 131* 131*  K 4.0 4.8 3.7  CL 98 97* 96*  CO2 26 26 29   GLUCOSE 103* 104* 111*  BUN 26* 54* 27*  CREATININE 3.34* 6.13* 3.37*  CALCIUM 9.7 10.0 9.3  PHOS  --  5.3* 3.3    ABG: No results for input(s): PHART, PCO2ART, PO2ART, HCO3, O2SAT in the last 168 hours.  Liver Function Tests: Recent Labs  Lab 08/18/21 0257 08/20/21 0734 08/22/21 0810  AST 25  --   --   ALT 24  --   --   ALKPHOS 144*  --   --   BILITOT 0.3  --   --   PROT 6.4*  --   --   ALBUMIN 2.3* 2.4* 2.5*   No results for input(s): LIPASE, AMYLASE in the last 168 hours. No results for input(s): AMMONIA in the last 168 hours.  CBC: Recent Labs  Lab 08/18/21 0257 08/20/21 0734 08/22/21 0810  WBC 6.0 5.9 5.2  HGB 8.8* 9.3* 8.9*  HCT 27.8* 28.6* 27.3*  MCV 94.2 92.0 92.2  PLT 215 201 179    Cardiac Enzymes: No results for input(s): CKTOTAL, CKMB, CKMBINDEX, TROPONINI in the last 168  hours.  BNP (last 3 results) No results for input(s): BNP in the last 8760 hours.  ProBNP (last 3 results) No results for input(s): PROBNP in the last 8760 hours.  Radiological Exams: No results found.  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 [facility])   Acute on chronic respiratory failure with hypoxia (HCC)   COPD, severe (HCC)   Seizure disorder (HCC)   Acute metabolic encephalopathy   Acute on chronic respiratory failure with hypoxia plan is to continue with capping trials patient is on goal of 72 hours. End-stage renal failure on hemodialysis Severe COPD medical management we will continue to follow along. Seizure disorder no signs of seizures Metabolic encephalopathy   I have personally seen and evaluated the patient, evaluated laboratory and imaging results, formulated the assessment and plan and placed orders. The Patient requires high complexity decision making with multiple systems involvement.  Rounds were done with the Respiratory Therapy Director and Staff therapists and discussed with nursing staff also.  Allyne Gee, MD [facility] Pulmonary Critical Care Medicine Sleep Medicine

## 2021-08-24 DIAGNOSIS — J9621 Acute and chronic respiratory failure with hypoxia: Secondary | ICD-10-CM | POA: Diagnosis not present

## 2021-08-24 DIAGNOSIS — N186 End stage renal disease: Secondary | ICD-10-CM | POA: Diagnosis not present

## 2021-08-24 DIAGNOSIS — G9341 Metabolic encephalopathy: Secondary | ICD-10-CM | POA: Diagnosis not present

## 2021-08-24 DIAGNOSIS — J449 Chronic obstructive pulmonary disease, unspecified: Secondary | ICD-10-CM | POA: Diagnosis not present

## 2021-08-24 NOTE — Progress Notes (Signed)
[facility]  ROUNDING NOTE   Subjective:  Patient currently awake and alert. States that he is feeling well. Due for dialysis treatment again tomorrow.  Temperature 96.9 pulse 62 respirations 19 blood pressure 168/99   Objective:  Vital signs in last 24 hours:  Temperature 97 pulse 59 respirations 15 blood pressure 156/94   Physical Exam: General: No acute distress  Head: Normocephalic, atraumatic. Moist oral mucosal membranes  Eyes: Anicteric  Neck: Tracheostomy capped  Lungs:  Scattered rhonchi, normal effort  Heart: S1S2 no rubs  Abdomen:  Soft, nontender, bowel sounds present  Extremities: Trace peripheral edema.  Neurologic: Awake, alert, following commands  Skin: No acute rash  Access: Right IJ PermCath    Basic Metabolic Panel: Recent Labs  Lab 08/18/21 0257 08/20/21 0734 08/22/21 0810  NA 133* 131* 131*  K 4.0 4.8 3.7  CL 98 97* 96*  CO2 26 26 29   GLUCOSE 103* 104* 111*  BUN 26* 54* 27*  CREATININE 3.34* 6.13* 3.37*  CALCIUM 9.7 10.0 9.3  PHOS  --  5.3* 3.3     Liver Function Tests: Recent Labs  Lab 08/18/21 0257 08/20/21 0734 08/22/21 0810  AST 25  --   --   ALT 24  --   --   ALKPHOS 144*  --   --   BILITOT 0.3  --   --   PROT 6.4*  --   --   ALBUMIN 2.3* 2.4* 2.5*    No results for input(s): LIPASE, AMYLASE in the last 168 hours. No results for input(s): AMMONIA in the last 168 hours.  CBC: Recent Labs  Lab 08/18/21 0257 08/20/21 0734 08/22/21 0810  WBC 6.0 5.9 5.2  HGB 8.8* 9.3* 8.9*  HCT 27.8* 28.6* 27.3*  MCV 94.2 92.0 92.2  PLT 215 201 179     Cardiac Enzymes: No results for input(s): CKTOTAL, CKMB, CKMBINDEX, TROPONINI in the last 168 hours.  BNP: Invalid input(s): POCBNP  CBG: No results for input(s): GLUCAP in the last 168 hours.  Microbiology: Results for orders placed or performed during the hospital encounter of 11/11/19  Respiratory Panel by RT PCR (Flu A&B, Covid) - Nasopharyngeal Swab      Status: Abnormal   Collection Time: 11/12/19 12:01 AM   Specimen: Nasopharyngeal Swab  Result Value Ref Range Status   SARS Coronavirus 2 by RT PCR POSITIVE (A) NEGATIVE Final    Comment: RESULT CALLED TO, READ BACK BY AND VERIFIED WITH: H. MASHBURN,RN 0214 11/12/2019 T. TYSOR (NOTE) SARS-CoV-2 target nucleic acids are DETECTED. SARS-CoV-2 RNA is generally detectable in upper respiratory specimens  during the acute phase of infection. Positive results are indicative of the presence of the identified virus, but do not rule out bacterial infection or co-infection with other pathogens not detected by the test. Clinical correlation with patient history and other diagnostic information is necessary to determine patient infection status. The expected result is Negative. Fact Sheet for Patients:  PinkCheek.be Fact Sheet for Healthcare Providers: GravelBags.it This test is not yet approved or cleared by the [place] FDA and  has been authorized for detection and/or diagnosis of SARS-CoV-2 by FDA under an Emergency Use Authorization (EUA).  This EUA will remain in effect (meaning this test can be use d) for the duration of  the COVID-19 declaration under Section 564(b)(1) of the Act, 21 U.S.C. section 360bbb-3(b)(1), unless the authorization is terminated or revoked sooner.    Influenza A by PCR NEGATIVE NEGATIVE Final   Influenza B  by PCR NEGATIVE NEGATIVE Final    Comment: (NOTE) The Xpert Xpress SARS-CoV-2/FLU/RSV assay is intended as an aid in  the diagnosis of influenza from Nasopharyngeal swab specimens and  should not be used as a sole basis for treatment. Nasal washings and  aspirates are unacceptable for Xpert Xpress SARS-CoV-2/FLU/RSV  testing. Fact Sheet for Patients: PinkCheek.be Fact Sheet for Healthcare Providers: GravelBags.it This test is not yet  approved or cleared by the [place] FDA and  has been authorized for detection and/or diagnosis of SARS-CoV-2 by  FDA under an Emergency Use Authorization (EUA). This EUA will remain  in effect (meaning this test can be used) for the duration of the  Covid-19 declaration under Section 564(b)(1) of the Act, 21  U.S.C. section 360bbb-3(b)(1), unless the authorization is  terminated or revoked. Performed at [facility], [place] [address] [place], [place] [address]   Culture, blood (routine x 2)     Status: None   Collection Time: 11/12/19  3:38 AM   Specimen: BLOOD  Result Value Ref Range Status   Specimen Description BLOOD RIGHT ARM  Final   Special Requests   Final    BOTTLES DRAWN AEROBIC AND ANAEROBIC Blood Culture adequate volume Performed at [facility], [place] [address] [place], [place] [address]    Culture NO GROWTH 5 DAYS  Final   Report Status 11/17/2019 FINAL  Final  Culture, blood (routine x 2)     Status: None   Collection Time: 11/12/19  3:49 AM   Specimen: BLOOD  Result Value Ref Range Status   Specimen Description BLOOD RIGHT HAND  Final   Special Requests   Final    BOTTLES DRAWN AEROBIC AND ANAEROBIC Blood Culture adequate volume Performed at [facility], [place] [address] [place], [place] [address]    Culture NO GROWTH 5 DAYS  Final   Report Status 11/17/2019 FINAL  Final    Coagulation Studies: No results for input(s): LABPROT, INR in the last 72 hours.  Urinalysis: No results for input(s): COLORURINE, LABSPEC, PHURINE, GLUCOSEU, HGBUR, BILIRUBINUR, KETONESUR, PROTEINUR, UROBILINOGEN, NITRITE, LEUKOCYTESUR in the last 72 hours.  Invalid input(s): APPERANCEUR    Imaging: No results found.   Medications:       Assessment/ Plan:  55 y.o. 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   1.  ESRD on HD.  No immediate need for dialysis today.  We will plan for hemodialysis treatment again tomorrow.  2.  Anemia of chronic kidney disease.  Hemoglobin down to 8.9.  Continue to monitor.  3.  Secondary hyperparathyroidism.  Phosphorus 3.3 and acceptable.  4.  Acute respiratory failure.  Hopefully tracheostomy can be removed soon.  Tracheostomy remains capped at the moment.   LOS: 0 Billi Bright 11/28/20227:58 AM

## 2021-08-24 NOTE — Progress Notes (Signed)
Pulmonary [place]   PULMONARY CRITICAL CARE SERVICE  PROGRESS NOTE     Jake Williams  FOY:[id number]  DOB: [birth year]   DOA: 08/17/2021  Referring Physician: Satira Sark, MD  HPI: Jake Williams is a 55 y.o. male being followed for ventilator/airway/oxygen weaning Acute on Chronic Respiratory Failure.  Patient is capping on 1 L oxygen today respiratory therapy reports that there is minimal secretions  Medications: Reviewed on Rounds  Physical Exam:  Vitals: Temperature is 96.9 pulse 62 respiratory is 19 blood pressure is 160/99 saturations 95%  Ventilator Settings capping on 1 L oxygen  General: Comfortable at this time Neck: supple Cardiovascular: no malignant arrhythmias Respiratory: No rhonchi very coarse breath sounds Skin: no rash seen on limited exam Musculoskeletal: No gross abnormality Psychiatric:unable to assess Neurologic:no involuntary movements         Lab Data:   Basic Metabolic Panel: Recent Labs  Lab 08/18/21 0257 08/20/21 0734 08/22/21 0810  NA 133* 131* 131*  K 4.0 4.8 3.7  CL 98 97* 96*  CO2 26 26 29   GLUCOSE 103* 104* 111*  BUN 26* 54* 27*  CREATININE 3.34* 6.13* 3.37*  CALCIUM 9.7 10.0 9.3  PHOS  --  5.3* 3.3    ABG: No results for input(s): PHART, PCO2ART, PO2ART, HCO3, O2SAT in the last 168 hours.  Liver Function Tests: Recent Labs  Lab 08/18/21 0257 08/20/21 0734 08/22/21 0810  AST 25  --   --   ALT 24  --   --   ALKPHOS 144*  --   --   BILITOT 0.3  --   --   PROT 6.4*  --   --   ALBUMIN 2.3* 2.4* 2.5*   No results for input(s): LIPASE, AMYLASE in the last 168 hours. No results for input(s): AMMONIA in the last 168 hours.  CBC: Recent Labs  Lab 08/18/21 0257 08/20/21 0734 08/22/21 0810  WBC 6.0 5.9 5.2  HGB 8.8* 9.3* 8.9*  HCT 27.8* 28.6* 27.3*  MCV 94.2 92.0 92.2  PLT 215 201 179    Cardiac Enzymes: No results for input(s): CKTOTAL, CKMB, CKMBINDEX,  TROPONINI in the last 168 hours.  BNP (last 3 results) No results for input(s): BNP in the last 8760 hours.  ProBNP (last 3 results) No results for input(s): PROBNP in the last 8760 hours.  Radiological Exams: No results found.  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 [facility])   Acute on chronic respiratory failure with hypoxia (HCC)   COPD, severe (HCC)   Seizure disorder (HCC)   Acute metabolic encephalopathy   Acute on chronic respiratory failure with hypoxia patient is currently on capping trial secretions are minimal plan is going to be to continue with full supportive care.  Also titrate oxygen as tolerated End-stage renal failure patient has been seen by nephrology for dialysis Severe COPD medical management nebulizers as needed Seizure disorder no active seizures Acute metabolic encephalopathy supportive care   I have personally seen and evaluated the patient, evaluated laboratory and imaging results, formulated the assessment and plan and placed orders. The Patient requires high complexity decision making with multiple systems involvement.  Rounds were done with the Respiratory Therapy Director and Staff therapists and discussed with nursing staff also.  Allyne Gee, MD [facility] Pulmonary Critical Care Medicine Sleep Medicine

## 2021-08-25 ENCOUNTER — Other Ambulatory Visit (HOSPITAL_COMMUNITY): Payer: Self-pay

## 2021-08-25 DIAGNOSIS — J9621 Acute and chronic respiratory failure with hypoxia: Secondary | ICD-10-CM | POA: Diagnosis not present

## 2021-08-25 DIAGNOSIS — G9341 Metabolic encephalopathy: Secondary | ICD-10-CM | POA: Diagnosis not present

## 2021-08-25 DIAGNOSIS — N186 End stage renal disease: Secondary | ICD-10-CM | POA: Diagnosis not present

## 2021-08-25 DIAGNOSIS — J449 Chronic obstructive pulmonary disease, unspecified: Secondary | ICD-10-CM | POA: Diagnosis not present

## 2021-08-25 LAB — CBC
HCT: 27.3 % — ABNORMAL LOW (ref 39.0–52.0)
HCT: 27.5 % — ABNORMAL LOW (ref 39.0–52.0)
Hemoglobin: 9.1 g/dL — ABNORMAL LOW (ref 13.0–17.0)
Hemoglobin: 9.1 g/dL — ABNORMAL LOW (ref 13.0–17.0)
MCH: 30.4 pg (ref 26.0–34.0)
MCH: 30.1 pg (ref 26.0–34.0)
MCHC: 33.3 g/dL (ref 30.0–36.0)
MCHC: 33.1 g/dL (ref 30.0–36.0)
MCV: 91.3 fL (ref 80.0–100.0)
MCV: 91.1 fL (ref 80.0–100.0)
Platelets: 159 10*3/uL (ref 150–400)
Platelets: 164 10*3/uL (ref 150–400)
RBC: 2.99 MIL/uL — ABNORMAL LOW (ref 4.22–5.81)
RBC: 3.02 MIL/uL — ABNORMAL LOW (ref 4.22–5.81)
RDW: 17.4 % — ABNORMAL HIGH (ref 11.5–15.5)
RDW: 17.5 % — ABNORMAL HIGH (ref 11.5–15.5)
WBC: 5.7 10*3/uL (ref 4.0–10.5)
WBC: 4.5 10*3/uL (ref 4.0–10.5)
nRBC: 0 % (ref 0.0–0.2)
nRBC: 0 % (ref 0.0–0.2)

## 2021-08-25 LAB — RENAL FUNCTION PANEL
Albumin: 2.6 g/dL — ABNORMAL LOW (ref 3.5–5.0)
Albumin: 2.7 g/dL — ABNORMAL LOW (ref 3.5–5.0)
Anion gap: 12 (ref 5–15)
Anion gap: 7 (ref 5–15)
BUN: 60 mg/dL — ABNORMAL HIGH (ref 6–20)
BUN: 22 mg/dL — ABNORMAL HIGH (ref 6–20)
CO2: 25 mmol/L (ref 22–32)
CO2: 30 mmol/L (ref 22–32)
Calcium: 10.1 mg/dL (ref 8.9–10.3)
Calcium: 9.4 mg/dL (ref 8.9–10.3)
Chloride: 93 mmol/L — ABNORMAL LOW (ref 98–111)
Chloride: 96 mmol/L — ABNORMAL LOW (ref 98–111)
Creatinine, Ser: 6.11 mg/dL — ABNORMAL HIGH (ref 0.61–1.24)
Creatinine, Ser: 3.26 mg/dL — ABNORMAL HIGH (ref 0.61–1.24)
GFR, Estimated: 10 mL/min — ABNORMAL LOW (ref >60)
GFR, Estimated: 22 mL/min — ABNORMAL LOW (ref >60)
Glucose, Bld: 102 mg/dL — ABNORMAL HIGH (ref 70–99)
Glucose, Bld: 119 mg/dL — ABNORMAL HIGH (ref 70–99)
Phosphorus: 5.5 mg/dL — ABNORMAL HIGH (ref 2.5–4.6)
Phosphorus: 2.8 mg/dL (ref 2.5–4.6)
Potassium: 4.3 mmol/L (ref 3.5–5.1)
Potassium: 4.1 mmol/L (ref 3.5–5.1)
Sodium: 130 mmol/L — ABNORMAL LOW (ref 135–145)
Sodium: 133 mmol/L — ABNORMAL LOW (ref 135–145)

## 2021-08-25 NOTE — Progress Notes (Signed)
Pulmonary [place]   PULMONARY CRITICAL CARE SERVICE  PROGRESS NOTE     Jake Williams  BZJ:[id number]  DOB: [birth year]   DOA: 08/17/2021  Referring Physician: Satira Sark, MD  HPI: Jake Williams is a 55 y.o. male being followed for ventilator/airway/oxygen weaning Acute on Chronic Respiratory Failure.  Patient still has very copious secretions noted.  Respiratory therapy reports frequent suctioning being required  Medications: Reviewed on Rounds  Physical Exam:  Vitals: Temperature is 96.8 pulse 54 respiratory is 14 blood pressure 187/107 saturations 96%  Ventilator Settings capping off the ventilator  General: Comfortable at this time Neck: supple Cardiovascular: no malignant arrhythmias Respiratory: No rhonchi very coarse breath sounds Skin: no rash seen on limited exam Musculoskeletal: No gross abnormality Psychiatric:unable to assess Neurologic:no involuntary movements         Lab Data:   Basic Metabolic Panel: Recent Labs  Lab 08/20/21 0734 08/22/21 0810 08/25/21 0329  NA 131* 131* 130*  K 4.8 3.7 4.3  CL 97* 96* 93*  CO2 26 29 25   GLUCOSE 104* 111* 102*  BUN 54* 27* 60*  CREATININE 6.13* 3.37* 6.11*  CALCIUM 10.0 9.3 10.1  PHOS 5.3* 3.3 5.5*    ABG: No results for input(s): PHART, PCO2ART, PO2ART, HCO3, O2SAT in the last 168 hours.  Liver Function Tests: Recent Labs  Lab 08/20/21 0734 08/22/21 0810 08/25/21 0329  ALBUMIN 2.4* 2.5* 2.6*   No results for input(s): LIPASE, AMYLASE in the last 168 hours. No results for input(s): AMMONIA in the last 168 hours.  CBC: Recent Labs  Lab 08/20/21 0734 08/22/21 0810 08/25/21 0329  WBC 5.9 5.2 5.7  HGB 9.3* 8.9* 9.1*  HCT 28.6* 27.3* 27.3*  MCV 92.0 92.2 91.3  PLT 201 179 159    Cardiac Enzymes: No results for input(s): CKTOTAL, CKMB, CKMBINDEX, TROPONINI in the last 168 hours.  BNP (last 3 results) No results for input(s): BNP in  the last 8760 hours.  ProBNP (last 3 results) No results for input(s): PROBNP in the last 8760 hours.  Radiological Exams: No results found.  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 (HCC)   Acute on chronic respiratory failure with hypoxia (HCC)   COPD, severe (HCC)   Seizure disorder (HCC)   Acute metabolic encephalopathy   Acute on chronic respiratory failure with hypoxia plan is going to be to continue with capping however because the patient is requiring frequent suctioning we will not be able to decannulate today End-stage renal failure patient is on hemodialysis we will continue to follow along closely. Severe COPD medical management Seizure disorder no sign of active seizures at this time. Metabolic encephalopathy at baseline we will continue to follow along closely.   I have personally seen and evaluated the patient, evaluated laboratory and imaging results, formulated the assessment and plan and placed orders. The Patient requires high complexity decision making with multiple systems involvement.  Rounds were done with the Respiratory Therapy Director and Staff therapists and discussed with nursing staff also.  Allyne Gee, MD [facility] Pulmonary Critical Care Medicine Sleep Medicine

## 2021-08-26 DIAGNOSIS — J9621 Acute and chronic respiratory failure with hypoxia: Secondary | ICD-10-CM | POA: Diagnosis not present

## 2021-08-26 DIAGNOSIS — G9341 Metabolic encephalopathy: Secondary | ICD-10-CM | POA: Diagnosis not present

## 2021-08-26 DIAGNOSIS — J449 Chronic obstructive pulmonary disease, unspecified: Secondary | ICD-10-CM | POA: Diagnosis not present

## 2021-08-26 DIAGNOSIS — N186 End stage renal disease: Secondary | ICD-10-CM | POA: Diagnosis not present

## 2021-08-26 NOTE — Progress Notes (Signed)
[facility]  ROUNDING NOTE   Subjective:  Patient seen at bedside. He is arousable and does follow simple commands. Tracheostomy uncapped this AM.  Temperature 96 pulse 64 respirations 12 blood pressure 178/89   Objective:  Vital signs in last 24 hours:  Temperature 97 pulse 59 respirations 15 blood pressure 156/94   Physical Exam: General: No acute distress  Head: Normocephalic, atraumatic. Moist oral mucosal membranes  Eyes: Anicteric  Neck: Tracheostomy uncapped this AM  Lungs:  Scattered rhonchi, normal effort  Heart: S1S2 no rubs  Abdomen:  Soft, nontender, bowel sounds present  Extremities: Trace peripheral edema.  Neurologic: Awake, alert, following commands  Skin: No acute rash  Access: Right IJ PermCath    Basic Metabolic Panel: Recent Labs  Lab 08/20/21 0734 08/22/21 0810 08/25/21 0329 08/25/21 1442  NA 131* 131* 130* 133*  K 4.8 3.7 4.3 4.1  CL 97* 96* 93* 96*  CO2 26 29 25 30   GLUCOSE 104* 111* 102* 119*  BUN 54* 27* 60* 22*  CREATININE 6.13* 3.37* 6.11* 3.26*  CALCIUM 10.0 9.3 10.1 9.4  PHOS 5.3* 3.3 5.5* 2.8     Liver Function Tests: Recent Labs  Lab 08/20/21 0734 08/22/21 0810 08/25/21 0329 08/25/21 1442  ALBUMIN 2.4* 2.5* 2.6* 2.7*    No results for input(s): LIPASE, AMYLASE in the last 168 hours. No results for input(s): AMMONIA in the last 168 hours.  CBC: Recent Labs  Lab 08/20/21 0734 08/22/21 0810 08/25/21 0329 08/25/21 1442  WBC 5.9 5.2 5.7 4.5  HGB 9.3* 8.9* 9.1* 9.1*  HCT 28.6* 27.3* 27.3* 27.5*  MCV 92.0 92.2 91.3 91.1  PLT 201 179 159 164     Cardiac Enzymes: No results for input(s): CKTOTAL, CKMB, CKMBINDEX, TROPONINI in the last 168 hours.  BNP: Invalid input(s): POCBNP  CBG: No results for input(s): GLUCAP in the last 168 hours.  Microbiology: Results for orders placed or performed during the hospital encounter of 11/11/19  Respiratory Panel by RT PCR (Flu A&B, Covid) - Nasopharyngeal Swab      Status: Abnormal   Collection Time: 11/12/19 12:01 AM   Specimen: Nasopharyngeal Swab  Result Value Ref Range Status   SARS Coronavirus 2 by RT PCR POSITIVE (A) NEGATIVE Final    Comment: RESULT CALLED TO, READ BACK BY AND VERIFIED WITH: H. MASHBURN,RN 0214 11/12/2019 T. TYSOR (NOTE) SARS-CoV-2 target nucleic acids are DETECTED. SARS-CoV-2 RNA is generally detectable in upper respiratory specimens  during the acute phase of infection. Positive results are indicative of the presence of the identified virus, but do not rule out bacterial infection or co-infection with other pathogens not detected by the test. Clinical correlation with patient history and other diagnostic information is necessary to determine patient infection status. The expected result is Negative. Fact Sheet for Patients:  PinkCheek.be Fact Sheet for Healthcare Providers: GravelBags.it This test is not yet approved or cleared by the [place] FDA and  has been authorized for detection and/or diagnosis of SARS-CoV-2 by FDA under an Emergency Use Authorization (EUA).  This EUA will remain in effect (meaning this test can be use d) for the duration of  the COVID-19 declaration under Section 564(b)(1) of the Act, 21 U.S.C. section 360bbb-3(b)(1), unless the authorization is terminated or revoked sooner.    Influenza A by PCR NEGATIVE NEGATIVE Final   Influenza B by PCR NEGATIVE NEGATIVE Final    Comment: (NOTE) The Xpert Xpress SARS-CoV-2/FLU/RSV assay is intended as an aid in  the diagnosis of  influenza from Nasopharyngeal swab specimens and  should not be used as a sole basis for treatment. Nasal washings and  aspirates are unacceptable for Xpert Xpress SARS-CoV-2/FLU/RSV  testing. Fact Sheet for Patients: PinkCheek.be Fact Sheet for Healthcare Providers: GravelBags.it This test is not yet  approved or cleared by the [place] FDA and  has been authorized for detection and/or diagnosis of SARS-CoV-2 by  FDA under an Emergency Use Authorization (EUA). This EUA will remain  in effect (meaning this test can be used) for the duration of the  Covid-19 declaration under Section 564(b)(1) of the Act, 21  U.S.C. section 360bbb-3(b)(1), unless the authorization is  terminated or revoked. Performed at [facility], [place] [address], [place], [place] [address]   Culture, blood (routine x 2)     Status: None   Collection Time: 11/12/19  3:38 AM   Specimen: BLOOD  Result Value Ref Range Status   Specimen Description BLOOD RIGHT ARM  Final   Special Requests   Final    BOTTLES DRAWN AEROBIC AND ANAEROBIC Blood Culture adequate volume Performed at [facility], [place] [address] [place], [place] [address]    Culture NO GROWTH 5 DAYS  Final   Report Status 11/17/2019 FINAL  Final  Culture, blood (routine x 2)     Status: None   Collection Time: 11/12/19  3:49 AM   Specimen: BLOOD  Result Value Ref Range Status   Specimen Description BLOOD RIGHT HAND  Final   Special Requests   Final    BOTTLES DRAWN AEROBIC AND ANAEROBIC Blood Culture adequate volume Performed at [facility], [place] [address], [place], [place] [address]    Culture NO GROWTH 5 DAYS  Final   Report Status 11/17/2019 FINAL  Final    Coagulation Studies: No results for input(s): LABPROT, INR in the last 72 hours.  Urinalysis: No results for input(s): COLORURINE, LABSPEC, PHURINE, GLUCOSEU, HGBUR, BILIRUBINUR, KETONESUR, PROTEINUR, UROBILINOGEN, NITRITE, LEUKOCYTESUR in the last 72 hours.  Invalid input(s): APPERANCEUR    Imaging: No results found.   Medications:       Assessment/ Plan:  55 y.o. 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   1.  ESRD on HD.  Patient underwent hemodialysis treatment yesterday.  We will plan for hemodialysis treatment again tomorrow.  2.  Anemia of chronic kidney disease.  Hemoglobin up to 9.1.  Maintain the patient on Retacrit 6000 units IV with dialysis.  3.  Secondary hyperparathyroidism.  Phosphorus currently at target at 2.8.  Continue to monitor.  4.  Acute respiratory failure.  Tracheostomy in place and functioning well.  Currently on This AM.   LOS: 0 Dyson Sevey 11/30/20228:15 AM

## 2021-08-26 NOTE — Progress Notes (Signed)
Pulmonary [place]   PULMONARY CRITICAL CARE SERVICE  PROGRESS NOTE     Jake Williams  MOL:[id number]  DOB: 25-Aug-1966   DOA: 08/17/2021  Referring Physician: Satira Sark, MD  HPI: Jake Williams is a 55 y.o. male being followed for ventilator/airway/oxygen weaning Acute on Chronic Respiratory Failure.  Patient is currently on 2 L oxygen doing well.  Patient has tracheostomy in place apparently had a coughing spell yesterday and he also did pull his trach out  Medications: Reviewed on Rounds  Physical Exam:  Vitals: Temperature is 96.0 pulse 64 respiratory 12 blood pressure is 109/64 saturations 97%  Ventilator Settings 2 L nasal cannula  General: Comfortable at this time Neck: supple Cardiovascular: no malignant arrhythmias Respiratory: Scattered rhonchi expansion is equal Skin: no rash seen on limited exam Musculoskeletal: No gross abnormality Psychiatric:unable to assess Neurologic:no involuntary movements         Lab Data:   Basic Metabolic Panel: Recent Labs  Lab 08/20/21 0734 08/22/21 0810 08/25/21 0329 08/25/21 1442  NA 131* 131* 130* 133*  K 4.8 3.7 4.3 4.1  CL 97* 96* 93* 96*  CO2 26 29 25 30   GLUCOSE 104* 111* 102* 119*  BUN 54* 27* 60* 22*  CREATININE 6.13* 3.37* 6.11* 3.26*  CALCIUM 10.0 9.3 10.1 9.4  PHOS 5.3* 3.3 5.5* 2.8    ABG: No results for input(s): PHART, PCO2ART, PO2ART, HCO3, O2SAT in the last 168 hours.  Liver Function Tests: Recent Labs  Lab 08/20/21 0734 08/22/21 0810 08/25/21 0329 08/25/21 1442  ALBUMIN 2.4* 2.5* 2.6* 2.7*   No results for input(s): LIPASE, AMYLASE in the last 168 hours. No results for input(s): AMMONIA in the last 168 hours.  CBC: Recent Labs  Lab 08/20/21 0734 08/22/21 0810 08/25/21 0329 08/25/21 1442  WBC 5.9 5.2 5.7 4.5  HGB 9.3* 8.9* 9.1* 9.1*  HCT 28.6* 27.3* 27.3* 27.5*  MCV 92.0 92.2 91.3 91.1  PLT 201 179 159 164    Cardiac  Enzymes: No results for input(s): CKTOTAL, CKMB, CKMBINDEX, TROPONINI in the last 168 hours.  BNP (last 3 results) No results for input(s): BNP in the last 8760 hours.  ProBNP (last 3 results) No results for input(s): PROBNP in the last 8760 hours.  Radiological Exams: No results found.  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 [facility])   Acute on chronic respiratory failure with hypoxia (HCC)   COPD, severe (HCC)   Seizure disorder (HCC)   Acute metabolic encephalopathy   Acute on chronic respiratory failure with hypoxia plan is to titrate oxygen as tolerated tracheostomy was put back in last night patient still reportedly has copious secretions but does have a good strong cough so we will continue to monitor progress End-stage renal disease patient is on dialysis we will continue with supportive care Severe COPD medical management Seizure disorder no active seizure noted at this time Metabolic encephalopathy slow to improve we will continue to follow along closely   I have personally seen and evaluated the patient, evaluated laboratory and imaging results, formulated the assessment and plan and placed orders. The Patient requires high complexity decision making with multiple systems involvement.  Rounds were done with the Respiratory Therapy Director and Staff therapists and discussed with nursing staff also.  Allyne Gee, MD [facility] Pulmonary Critical Care Medicine Sleep Medicine

## 2021-08-27 DIAGNOSIS — J449 Chronic obstructive pulmonary disease, unspecified: Secondary | ICD-10-CM | POA: Diagnosis not present

## 2021-08-27 DIAGNOSIS — G9341 Metabolic encephalopathy: Secondary | ICD-10-CM | POA: Diagnosis not present

## 2021-08-27 DIAGNOSIS — N186 End stage renal disease: Secondary | ICD-10-CM | POA: Diagnosis not present

## 2021-08-27 DIAGNOSIS — J9621 Acute and chronic respiratory failure with hypoxia: Secondary | ICD-10-CM | POA: Diagnosis not present

## 2021-08-27 LAB — RENAL FUNCTION PANEL
Albumin: 2.7 g/dL — ABNORMAL LOW (ref 3.5–5.0)
Anion gap: 10 (ref 5–15)
BUN: 45 mg/dL — ABNORMAL HIGH (ref 6–20)
CO2: 29 mmol/L (ref 22–32)
Calcium: 10.3 mg/dL (ref 8.9–10.3)
Chloride: 92 mmol/L — ABNORMAL LOW (ref 98–111)
Creatinine, Ser: 5.28 mg/dL — ABNORMAL HIGH (ref 0.61–1.24)
GFR, Estimated: 12 mL/min — ABNORMAL LOW (ref >60)
Glucose, Bld: 104 mg/dL — ABNORMAL HIGH (ref 70–99)
Phosphorus: 4.8 mg/dL — ABNORMAL HIGH (ref 2.5–4.6)
Potassium: 4.4 mmol/L (ref 3.5–5.1)
Sodium: 131 mmol/L — ABNORMAL LOW (ref 135–145)

## 2021-08-27 LAB — CBC
HCT: 27.2 % — ABNORMAL LOW (ref 39.0–52.0)
Hemoglobin: 8.9 g/dL — ABNORMAL LOW (ref 13.0–17.0)
MCH: 30 pg (ref 26.0–34.0)
MCHC: 32.7 g/dL (ref 30.0–36.0)
MCV: 91.6 fL (ref 80.0–100.0)
Platelets: 161 10*3/uL (ref 150–400)
RBC: 2.97 MIL/uL — ABNORMAL LOW (ref 4.22–5.81)
RDW: 17.2 % — ABNORMAL HIGH (ref 11.5–15.5)
WBC: 5.8 10*3/uL (ref 4.0–10.5)
nRBC: 0 % (ref 0.0–0.2)

## 2021-08-27 NOTE — Progress Notes (Signed)
Pulmonary [place]   PULMONARY CRITICAL CARE SERVICE  PROGRESS NOTE     Jake Williams  ZRA:[id number]  DOB: 06/25/1966   DOA: 08/17/2021  Referring Physician: Satira Sark, MD  HPI: Jake Williams is a 55 y.o. male being followed for ventilator/airway/oxygen weaning Acute on Chronic Respiratory Failure.  Capping on 2 L of oxygen good saturations are noted.  Medications: Reviewed on Rounds  Physical Exam:  Vitals: Temperature is 97.5 pulse 61 respiratory 16 blood pressure is 146/83 saturations 99%  Ventilator Settings capping off the ventilator on 2 L  General: Comfortable at this time Neck: supple Cardiovascular: no malignant arrhythmias Respiratory: No rhonchi no rales are noted at this time Skin: no rash seen on limited exam Musculoskeletal: No gross abnormality Psychiatric:unable to assess Neurologic:no involuntary movements         Lab Data:   Basic Metabolic Panel: Recent Labs  Lab 08/22/21 0810 08/25/21 0329 08/25/21 1442 08/27/21 0227  NA 131* 130* 133* 131*  K 3.7 4.3 4.1 4.4  CL 96* 93* 96* 92*  CO2 29 25 30 29   GLUCOSE 111* 102* 119* 104*  BUN 27* 60* 22* 45*  CREATININE 3.37* 6.11* 3.26* 5.28*  CALCIUM 9.3 10.1 9.4 10.3  PHOS 3.3 5.5* 2.8 4.8*    ABG: No results for input(s): PHART, PCO2ART, PO2ART, HCO3, O2SAT in the last 168 hours.  Liver Function Tests: Recent Labs  Lab 08/22/21 0810 08/25/21 0329 08/25/21 1442 08/27/21 0227  ALBUMIN 2.5* 2.6* 2.7* 2.7*   No results for input(s): LIPASE, AMYLASE in the last 168 hours. No results for input(s): AMMONIA in the last 168 hours.  CBC: Recent Labs  Lab 08/22/21 0810 08/25/21 0329 08/25/21 1442 08/27/21 0227  WBC 5.2 5.7 4.5 5.8  HGB 8.9* 9.1* 9.1* 8.9*  HCT 27.3* 27.3* 27.5* 27.2*  MCV 92.2 91.3 91.1 91.6  PLT 179 159 164 161    Cardiac Enzymes: No results for input(s): CKTOTAL, CKMB, CKMBINDEX, TROPONINI in the last 168  hours.  BNP (last 3 results) No results for input(s): BNP in the last 8760 hours.  ProBNP (last 3 results) No results for input(s): PROBNP in the last 8760 hours.  Radiological Exams: No results found.  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 [facility])   Acute on chronic respiratory failure with hypoxia (HCC)   COPD, severe (HCC)   Seizure disorder (HCC)   Acute metabolic encephalopathy   Acute on chronic respiratory failure with hypoxia continue with capping secretions are limiting factor as far as being able to proceed with decannulation End-stage renal failure patient is on hemodialysis supportive care nephrology recommendations Severe COPD medical management Seizure disorder no signs of active seizures at this time we will continue to follow along. Metabolic encephalopathy no change   I have personally seen and evaluated the patient, evaluated laboratory and imaging results, formulated the assessment and plan and placed orders. The Patient requires high complexity decision making with multiple systems involvement.  Rounds were done with the Respiratory Therapy Director and Staff therapists and discussed with nursing staff also.  Allyne Gee, MD [facility] Pulmonary Critical Care Medicine Sleep Medicine

## 2021-08-28 DIAGNOSIS — G9341 Metabolic encephalopathy: Secondary | ICD-10-CM | POA: Diagnosis not present

## 2021-08-28 DIAGNOSIS — N186 End stage renal disease: Secondary | ICD-10-CM | POA: Diagnosis not present

## 2021-08-28 DIAGNOSIS — J9621 Acute and chronic respiratory failure with hypoxia: Secondary | ICD-10-CM | POA: Diagnosis not present

## 2021-08-28 DIAGNOSIS — J449 Chronic obstructive pulmonary disease, unspecified: Secondary | ICD-10-CM | POA: Diagnosis not present

## 2021-08-28 NOTE — Progress Notes (Signed)
[facility]  ROUNDING NOTE   Subjective:  Patient resting comfortably. Maintained on HD TTHS.  Hgb down to 8.9, on retacrit.    Objective:  Vital signs in last 24 hours:  Temperature 98 pulse 59 respirations 19 blood pressure 163/98   Physical Exam: General: No acute distress  Head: Normocephalic, atraumatic. Moist oral mucosal membranes  Eyes: Anicteric  Neck: Tracheostomy uncapped this AM  Lungs:  Scattered rhonchi, normal effort  Heart: S1S2 no rubs  Abdomen:  Soft, nontender, bowel sounds present  Extremities: Trace peripheral edema.  Neurologic: Awake, alert, following commands  Skin: No acute rash  Access: Right IJ PermCath    Basic Metabolic Panel: Recent Labs  Lab 08/22/21 0810 08/25/21 0329 08/25/21 1442 08/27/21 0227  NA 131* 130* 133* 131*  K 3.7 4.3 4.1 4.4  CL 96* 93* 96* 92*  CO2 29 25 30 29   GLUCOSE 111* 102* 119* 104*  BUN 27* 60* 22* 45*  CREATININE 3.37* 6.11* 3.26* 5.28*  CALCIUM 9.3 10.1 9.4 10.3  PHOS 3.3 5.5* 2.8 4.8*     Liver Function Tests: Recent Labs  Lab 08/22/21 0810 08/25/21 0329 08/25/21 1442 08/27/21 0227  ALBUMIN 2.5* 2.6* 2.7* 2.7*    No results for input(s): LIPASE, AMYLASE in the last 168 hours. No results for input(s): AMMONIA in the last 168 hours.  CBC: Recent Labs  Lab 08/22/21 0810 08/25/21 0329 08/25/21 1442 08/27/21 0227  WBC 5.2 5.7 4.5 5.8  HGB 8.9* 9.1* 9.1* 8.9*  HCT 27.3* 27.3* 27.5* 27.2*  MCV 92.2 91.3 91.1 91.6  PLT 179 159 164 161     Cardiac Enzymes: No results for input(s): CKTOTAL, CKMB, CKMBINDEX, TROPONINI in the last 168 hours.  BNP: Invalid input(s): POCBNP  CBG: No results for input(s): GLUCAP in the last 168 hours.  Microbiology: Results for orders placed or performed during the hospital encounter of 11/11/19  Respiratory Panel by RT PCR (Flu A&B, Covid) - Nasopharyngeal Swab     Status: Abnormal   Collection Time: 11/12/19 12:01 AM   Specimen:  Nasopharyngeal Swab  Result Value Ref Range Status   SARS Coronavirus 2 by RT PCR POSITIVE (A) NEGATIVE Final    Comment: RESULT CALLED TO, READ BACK BY AND VERIFIED WITH: H. MASHBURN,RN 0214 11/12/2019 T. TYSOR (NOTE) SARS-CoV-2 target nucleic acids are DETECTED. SARS-CoV-2 RNA is generally detectable in upper respiratory specimens  during the acute phase of infection. Positive results are indicative of the presence of the identified virus, but do not rule out bacterial infection or co-infection with other pathogens not detected by the test. Clinical correlation with patient history and other diagnostic information is necessary to determine patient infection status. The expected result is Negative. Fact Sheet for Patients:  PinkCheek.be Fact Sheet for Healthcare Providers: GravelBags.it This test is not yet approved or cleared by the [place] FDA and  has been authorized for detection and/or diagnosis of SARS-CoV-2 by FDA under an Emergency Use Authorization (EUA).  This EUA will remain in effect (meaning this test can be use d) for the duration of  the COVID-19 declaration under Section 564(b)(1) of the Act, 21 U.S.C. section 360bbb-3(b)(1), unless the authorization is terminated or revoked sooner.    Influenza A by PCR NEGATIVE NEGATIVE Final   Influenza B by PCR NEGATIVE NEGATIVE Final    Comment: (NOTE) The Xpert Xpress SARS-CoV-2/FLU/RSV assay is intended as an aid in  the diagnosis of influenza from Nasopharyngeal swab specimens and  should not be used  as a sole basis for treatment. Nasal washings and  aspirates are unacceptable for Xpert Xpress SARS-CoV-2/FLU/RSV  testing. Fact Sheet for Patients: PinkCheek.be Fact Sheet for Healthcare Providers: GravelBags.it This test is not yet approved or cleared by the [place] FDA and  has been authorized for  detection and/or diagnosis of SARS-CoV-2 by  FDA under an Emergency Use Authorization (EUA). This EUA will remain  in effect (meaning this test can be used) for the duration of the  Covid-19 declaration under Section 564(b)(1) of the Act, 21  U.S.C. section 360bbb-3(b)(1), unless the authorization is  terminated or revoked. Performed at [facility], [place] [address], [place], [place] [address]   Culture, blood (routine x 2)     Status: None   Collection Time: 11/12/19  3:38 AM   Specimen: BLOOD  Result Value Ref Range Status   Specimen Description BLOOD RIGHT ARM  Final   Special Requests   Final    BOTTLES DRAWN AEROBIC AND ANAEROBIC Blood Culture adequate volume Performed at [facility], [place] [address], [place], [place] [address]    Culture NO GROWTH 5 DAYS  Final   Report Status 11/17/2019 FINAL  Final  Culture, blood (routine x 2)     Status: None   Collection Time: 11/12/19  3:49 AM   Specimen: BLOOD  Result Value Ref Range Status   Specimen Description BLOOD RIGHT HAND  Final   Special Requests   Final    BOTTLES DRAWN AEROBIC AND ANAEROBIC Blood Culture adequate volume Performed at [facility], [place] [address] [place], [place] [address]    Culture NO GROWTH 5 DAYS  Final   Report Status 11/17/2019 FINAL  Final    Coagulation Studies: No results for input(s): LABPROT, INR in the last 72 hours.  Urinalysis: No results for input(s): COLORURINE, LABSPEC, PHURINE, GLUCOSEU, HGBUR, BILIRUBINUR, KETONESUR, PROTEINUR, UROBILINOGEN, NITRITE, LEUKOCYTESUR in the last 72 hours.  Invalid input(s): APPERANCEUR    Imaging: No results found.   Medications:       Assessment/ Plan:  55 y.o. 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   1.  ESRD on HD.  Pt due for HD treatment again tomorrow, orders prepared.   2.  Anemia of chronic kidney disease.  Hgb down slightly to 8.9, continue retacrit 6000 units IV with HD.   3.  Secondary hyperparathyroidism.  Phosphorus acceptable at 4.8, will monitor.   4.  Acute respiratory failure.  Tracheostomy in place and functioning well.  Currently uncapped this AM.    LOS: 0 Jaiyah Beining 12/2/20228:09 AM

## 2021-08-28 NOTE — Progress Notes (Signed)
Pulmonary [place]   PULMONARY CRITICAL CARE SERVICE  PROGRESS NOTE     Jake Williams  CZY:[id number]  DOB: 12-20-1965   DOA: 08/17/2021  Referring Physician: Satira Sark, MD  HPI: Jake Williams is a 55 y.o. male being followed for ventilator/airway/oxygen weaning Acute on Chronic Respiratory Failure.  Patient is comfortable without distress has been capping no new changes are noted.  Medications: Reviewed on Rounds  Physical Exam:  Vitals: Temperature is 96.0 pulse 59 respiratory rate is 19 blood pressure 163/98 saturations 99%  Ventilator Settings capping off the ventilator.  General: Comfortable at this time Neck: supple Cardiovascular: no malignant arrhythmias Respiratory: No rhonchi very coarse breath sounds Skin: no rash seen on limited exam Musculoskeletal: No gross abnormality Psychiatric:unable to assess Neurologic:no involuntary movements         Lab Data:   Basic Metabolic Panel: Recent Labs  Lab 08/22/21 0810 08/25/21 0329 08/25/21 1442 08/27/21 0227  NA 131* 130* 133* 131*  K 3.7 4.3 4.1 4.4  CL 96* 93* 96* 92*  CO2 29 25 30 29   GLUCOSE 111* 102* 119* 104*  BUN 27* 60* 22* 45*  CREATININE 3.37* 6.11* 3.26* 5.28*  CALCIUM 9.3 10.1 9.4 10.3  PHOS 3.3 5.5* 2.8 4.8*    ABG: No results for input(s): PHART, PCO2ART, PO2ART, HCO3, O2SAT in the last 168 hours.  Liver Function Tests: Recent Labs  Lab 08/22/21 0810 08/25/21 0329 08/25/21 1442 08/27/21 0227  ALBUMIN 2.5* 2.6* 2.7* 2.7*   No results for input(s): LIPASE, AMYLASE in the last 168 hours. No results for input(s): AMMONIA in the last 168 hours.  CBC: Recent Labs  Lab 08/22/21 0810 08/25/21 0329 08/25/21 1442 08/27/21 0227  WBC 5.2 5.7 4.5 5.8  HGB 8.9* 9.1* 9.1* 8.9*  HCT 27.3* 27.3* 27.5* 27.2*  MCV 92.2 91.3 91.1 91.6  PLT 179 159 164 161    Cardiac Enzymes: No results for input(s): CKTOTAL, CKMB, CKMBINDEX,  TROPONINI in the last 168 hours.  BNP (last 3 results) No results for input(s): BNP in the last 8760 hours.  ProBNP (last 3 results) No results for input(s): PROBNP in the last 8760 hours.  Radiological Exams: No results found.  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 [facility])   Acute on chronic respiratory failure with hypoxia (HCC)   COPD, severe (HCC)   Seizure disorder (HCC)   Acute metabolic encephalopathy   Acute on chronic respiratory failure with hypoxia we will continue with capping trial secretions are still very copious not able to do decannulation End-stage renal failure on dialysis nephrology following along Severe COPD medical management Seizure disorder no sign of active seizures Metabolic encephalopathy at baseline   I have personally seen and evaluated the patient, evaluated laboratory and imaging results, formulated the assessment and plan and placed orders. The Patient requires high complexity decision making with multiple systems involvement.  Rounds were done with the Respiratory Therapy Director and Staff therapists and discussed with nursing staff also.  Allyne Gee, MD [facility] Pulmonary Critical Care Medicine Sleep Medicine

## 2021-08-29 LAB — RENAL FUNCTION PANEL
Albumin: 2.7 g/dL — ABNORMAL LOW (ref 3.5–5.0)
Anion gap: 10 (ref 5–15)
BUN: 43 mg/dL — ABNORMAL HIGH (ref 6–20)
CO2: 28 mmol/L (ref 22–32)
Calcium: 10.5 mg/dL — ABNORMAL HIGH (ref 8.9–10.3)
Chloride: 95 mmol/L — ABNORMAL LOW (ref 98–111)
Creatinine, Ser: 5.25 mg/dL — ABNORMAL HIGH (ref 0.61–1.24)
GFR, Estimated: 12 mL/min — ABNORMAL LOW (ref >60)
Glucose, Bld: 104 mg/dL — ABNORMAL HIGH (ref 70–99)
Phosphorus: 4.9 mg/dL — ABNORMAL HIGH (ref 2.5–4.6)
Potassium: 4.1 mmol/L (ref 3.5–5.1)
Sodium: 133 mmol/L — ABNORMAL LOW (ref 135–145)

## 2021-08-29 LAB — CBC
HCT: 27.8 % — ABNORMAL LOW (ref 39.0–52.0)
Hemoglobin: 9.1 g/dL — ABNORMAL LOW (ref 13.0–17.0)
MCH: 30 pg (ref 26.0–34.0)
MCHC: 32.7 g/dL (ref 30.0–36.0)
MCV: 91.7 fL (ref 80.0–100.0)
Platelets: 168 10*3/uL (ref 150–400)
RBC: 3.03 MIL/uL — ABNORMAL LOW (ref 4.22–5.81)
RDW: 17 % — ABNORMAL HIGH (ref 11.5–15.5)
WBC: 5.9 10*3/uL (ref 4.0–10.5)
nRBC: 0 % (ref 0.0–0.2)

## 2021-08-31 DIAGNOSIS — N186 End stage renal disease: Secondary | ICD-10-CM | POA: Diagnosis not present

## 2021-08-31 DIAGNOSIS — G9341 Metabolic encephalopathy: Secondary | ICD-10-CM | POA: Diagnosis not present

## 2021-08-31 DIAGNOSIS — J9621 Acute and chronic respiratory failure with hypoxia: Secondary | ICD-10-CM | POA: Diagnosis not present

## 2021-08-31 DIAGNOSIS — J449 Chronic obstructive pulmonary disease, unspecified: Secondary | ICD-10-CM | POA: Diagnosis not present

## 2021-08-31 NOTE — Progress Notes (Signed)
[facility]  ROUNDING NOTE   Subjective:  Patient continues on dialysis on TTS schedule. Due for dialysis again tomorrow. Resting comfortably at the moment.  Temperature 97 pulse 49 respirations 16 blood pressure 160/92   Objective:  Vital signs in last 24 hours:  Temperature 98 pulse 59 respirations 19 blood pressure 163/98   Physical Exam: General: No acute distress  Head: Normocephalic, atraumatic. Moist oral mucosal membranes  Eyes: Anicteric  Neck: Tracheostomy in place  Lungs:  Scattered rhonchi, normal effort  Heart: S1S2 no rubs  Abdomen:  Soft, nontender, bowel sounds present  Extremities: Trace peripheral edema.  Neurologic: Resting comfortably but arousable  Skin: No acute rash  Access: Right IJ PermCath    Basic Metabolic Panel: Recent Labs  Lab 08/25/21 0329 08/25/21 1442 08/27/21 0227 08/29/21 0742  NA 130* 133* 131* 133*  K 4.3 4.1 4.4 4.1  CL 93* 96* 92* 95*  CO2 25 30 29 28   GLUCOSE 102* 119* 104* 104*  BUN 60* 22* 45* 43*  CREATININE 6.11* 3.26* 5.28* 5.25*  CALCIUM 10.1 9.4 10.3 10.5*  PHOS 5.5* 2.8 4.8* 4.9*     Liver Function Tests: Recent Labs  Lab 08/25/21 0329 08/25/21 1442 08/27/21 0227 08/29/21 0742  ALBUMIN 2.6* 2.7* 2.7* 2.7*    No results for input(s): LIPASE, AMYLASE in the last 168 hours. No results for input(s): AMMONIA in the last 168 hours.  CBC: Recent Labs  Lab 08/25/21 0329 08/25/21 1442 08/27/21 0227 08/29/21 0741  WBC 5.7 4.5 5.8 5.9  HGB 9.1* 9.1* 8.9* 9.1*  HCT 27.3* 27.5* 27.2* 27.8*  MCV 91.3 91.1 91.6 91.7  PLT 159 164 161 168     Cardiac Enzymes: No results for input(s): CKTOTAL, CKMB, CKMBINDEX, TROPONINI in the last 168 hours.  BNP: Invalid input(s): POCBNP  CBG: No results for input(s): GLUCAP in the last 168 hours.  Microbiology: Results for orders placed or performed during the hospital encounter of 11/11/19  Respiratory Panel by RT PCR (Flu A&B, Covid) -  Nasopharyngeal Swab     Status: Abnormal   Collection Time: 11/12/19 12:01 AM   Specimen: Nasopharyngeal Swab  Result Value Ref Range Status   SARS Coronavirus 2 by RT PCR POSITIVE (A) NEGATIVE Final    Comment: RESULT CALLED TO, READ BACK BY AND VERIFIED WITH: H. MASHBURN,RN 0214 11/12/2019 T. TYSOR (NOTE) SARS-CoV-2 target nucleic acids are DETECTED. SARS-CoV-2 RNA is generally detectable in upper respiratory specimens  during the acute phase of infection. Positive results are indicative of the presence of the identified virus, but do not rule out bacterial infection or co-infection with other pathogens not detected by the test. Clinical correlation with patient history and other diagnostic information is necessary to determine patient infection status. The expected result is Negative. Fact Sheet for Patients:  PinkCheek.be Fact Sheet for Healthcare Providers: GravelBags.it This test is not yet approved or cleared by the [place] FDA and  has been authorized for detection and/or diagnosis of SARS-CoV-2 by FDA under an Emergency Use Authorization (EUA).  This EUA will remain in effect (meaning this test can be use d) for the duration of  the COVID-19 declaration under Section 564(b)(1) of the Act, 21 U.S.C. section 360bbb-3(b)(1), unless the authorization is terminated or revoked sooner.    Influenza A by PCR NEGATIVE NEGATIVE Final   Influenza B by PCR NEGATIVE NEGATIVE Final    Comment: (NOTE) The Xpert Xpress SARS-CoV-2/FLU/RSV assay is intended as an aid in  the diagnosis of  influenza from Nasopharyngeal swab specimens and  should not be used as a sole basis for treatment. Nasal washings and  aspirates are unacceptable for Xpert Xpress SARS-CoV-2/FLU/RSV  testing. Fact Sheet for Patients: PinkCheek.be Fact Sheet for Healthcare  Providers: GravelBags.it This test is not yet approved or cleared by the [place] FDA and  has been authorized for detection and/or diagnosis of SARS-CoV-2 by  FDA under an Emergency Use Authorization (EUA). This EUA will remain  in effect (meaning this test can be used) for the duration of the  Covid-19 declaration under Section 564(b)(1) of the Act, 21  U.S.C. section 360bbb-3(b)(1), unless the authorization is  terminated or revoked. Performed at [facility], [place] [address] [place], [place] [address]   Culture, blood (routine x 2)     Status: None   Collection Time: 11/12/19  3:38 AM   Specimen: BLOOD  Result Value Ref Range Status   Specimen Description BLOOD RIGHT ARM  Final   Special Requests   Final    BOTTLES DRAWN AEROBIC AND ANAEROBIC Blood Culture adequate volume Performed at [facility], [place] [address], [place], [place] [address]    Culture NO GROWTH 5 DAYS  Final   Report Status 11/17/2019 FINAL  Final  Culture, blood (routine x 2)     Status: None   Collection Time: 11/12/19  3:49 AM   Specimen: BLOOD  Result Value Ref Range Status   Specimen Description BLOOD RIGHT HAND  Final   Special Requests   Final    BOTTLES DRAWN AEROBIC AND ANAEROBIC Blood Culture adequate volume Performed at [facility], [place] [address] [place],  [address]    Culture NO GROWTH 5 DAYS  Final   Report Status 11/17/2019 FINAL  Final    Coagulation Studies: No results for input(s): LABPROT, INR in the last 72 hours.  Urinalysis: No results for input(s): COLORURINE, LABSPEC, PHURINE, GLUCOSEU, HGBUR, BILIRUBINUR, KETONESUR, PROTEINUR, UROBILINOGEN, NITRITE, LEUKOCYTESUR in the last 72 hours.  Invalid input(s): APPERANCEUR    Imaging: No results found.   Medications:       Assessment/ Plan:  55 y.o. 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   1.  ESRD on HD.  We will maintain the patient on TTS dialysis schedule.  Therefore he is due for dialysis treatment again tomorrow.  2.  Anemia of chronic kidney disease.  Hemoglobin up to 9.1 at last check.  Maintain the patient on Retacrit 6000 IV with dialysis treatments.  3.  Secondary hyperparathyroidism.  Phosphorus remains within acceptable range at 4.9.  4.  Acute respiratory failure.  Continues to breathe comfortably through tracheostomy.   LOS: 0 Janaisha Tolsma 12/5/20227:58 AM

## 2021-08-31 NOTE — Progress Notes (Signed)
Pulmonary [place]   PULMONARY CRITICAL CARE SERVICE  PROGRESS NOTE     Jake Williams  OMB:[id number]  DOB: August 13, 1966   DOA: 08/17/2021  Referring Physician: Satira Sark, MD  HPI: Jake Williams is a 55 y.o. male being followed for ventilator/airway/oxygen weaning Acute on Chronic Respiratory Failure.  Patient's been doing fine with capping has a actually very strong cough and apparently over the weekend coughed off the cap so the therapist placed him on T collar  Medications: Reviewed on Rounds  Physical Exam:  Vitals: Temperature is 97.0 pulse 50 respiratory rate 16 blood pressure is 160/92  Ventilator Settings capping right now off the ventilator  General: Comfortable at this time Neck: supple Cardiovascular: no malignant arrhythmias Respiratory: No rhonchi very coarse breath sounds Skin: no rash seen on limited exam Musculoskeletal: No gross abnormality Psychiatric:unable to assess Neurologic:no involuntary movements         Lab Data:   Basic Metabolic Panel: Recent Labs  Lab 08/25/21 0329 08/25/21 1442 08/27/21 0227 08/29/21 0742  NA 130* 133* 131* 133*  K 4.3 4.1 4.4 4.1  CL 93* 96* 92* 95*  CO2 25 30 29 28   GLUCOSE 102* 119* 104* 104*  BUN 60* 22* 45* 43*  CREATININE 6.11* 3.26* 5.28* 5.25*  CALCIUM 10.1 9.4 10.3 10.5*  PHOS 5.5* 2.8 4.8* 4.9*    ABG: No results for input(s): PHART, PCO2ART, PO2ART, HCO3, O2SAT in the last 168 hours.  Liver Function Tests: Recent Labs  Lab 08/25/21 0329 08/25/21 1442 08/27/21 0227 08/29/21 0742  ALBUMIN 2.6* 2.7* 2.7* 2.7*   No results for input(s): LIPASE, AMYLASE in the last 168 hours. No results for input(s): AMMONIA in the last 168 hours.  CBC: Recent Labs  Lab 08/25/21 0329 08/25/21 1442 08/27/21 0227 08/29/21 0741  WBC 5.7 4.5 5.8 5.9  HGB 9.1* 9.1* 8.9* 9.1*  HCT 27.3* 27.5* 27.2* 27.8*  MCV 91.3 91.1 91.6 91.7  PLT 159 164 161 168     Cardiac Enzymes: No results for input(s): CKTOTAL, CKMB, CKMBINDEX, TROPONINI in the last 168 hours.  BNP (last 3 results) No results for input(s): BNP in the last 8760 hours.  ProBNP (last 3 results) No results for input(s): PROBNP in the last 8760 hours.  Radiological Exams: No results found.  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 End-stage renal failure on hemodialysis Severe COPD medical management we will continue to monitor along closely. Seizure disorder no sign of active seizures at this time Metabolic encephalopathy patient is at baseline   I have personally seen and evaluated the patient, evaluated laboratory and imaging results, formulated the assessment and plan and placed orders. The Patient requires high complexity decision making with multiple systems involvement.  Rounds were done with the Respiratory Therapy Director and Staff therapists and discussed with nursing staff also.  Allyne Gee, MD [facility] Pulmonary Critical Care Medicine Sleep Medicine

## 2021-09-01 DIAGNOSIS — J9621 Acute and chronic respiratory failure with hypoxia: Secondary | ICD-10-CM | POA: Diagnosis not present

## 2021-09-01 DIAGNOSIS — N186 End stage renal disease: Secondary | ICD-10-CM | POA: Diagnosis not present

## 2021-09-01 DIAGNOSIS — J449 Chronic obstructive pulmonary disease, unspecified: Secondary | ICD-10-CM | POA: Diagnosis not present

## 2021-09-01 DIAGNOSIS — G9341 Metabolic encephalopathy: Secondary | ICD-10-CM | POA: Diagnosis not present

## 2021-09-01 LAB — RENAL FUNCTION PANEL
Albumin: 2.8 g/dL — ABNORMAL LOW (ref 3.5–5.0)
Anion gap: 11 (ref 5–15)
BUN: 53 mg/dL — ABNORMAL HIGH (ref 6–20)
CO2: 27 mmol/L (ref 22–32)
Calcium: 10.7 mg/dL — ABNORMAL HIGH (ref 8.9–10.3)
Chloride: 92 mmol/L — ABNORMAL LOW (ref 98–111)
Creatinine, Ser: 6.21 mg/dL — ABNORMAL HIGH (ref 0.61–1.24)
GFR, Estimated: 10 mL/min — ABNORMAL LOW (ref >60)
Glucose, Bld: 116 mg/dL — ABNORMAL HIGH (ref 70–99)
Phosphorus: 4.4 mg/dL (ref 2.5–4.6)
Potassium: 4.4 mmol/L (ref 3.5–5.1)
Sodium: 130 mmol/L — ABNORMAL LOW (ref 135–145)

## 2021-09-01 LAB — CBC
HCT: 28.2 % — ABNORMAL LOW (ref 39.0–52.0)
Hemoglobin: 9.4 g/dL — ABNORMAL LOW (ref 13.0–17.0)
MCH: 30.7 pg (ref 26.0–34.0)
MCHC: 33.3 g/dL (ref 30.0–36.0)
MCV: 92.2 fL (ref 80.0–100.0)
Platelets: 187 10*3/uL (ref 150–400)
RBC: 3.06 MIL/uL — ABNORMAL LOW (ref 4.22–5.81)
RDW: 17.4 % — ABNORMAL HIGH (ref 11.5–15.5)
WBC: 6.7 10*3/uL (ref 4.0–10.5)
nRBC: 0 % (ref 0.0–0.2)

## 2021-09-01 NOTE — Progress Notes (Signed)
Pulmonary [place]   PULMONARY CRITICAL CARE SERVICE  PROGRESS NOTE     Jake Williams  CVU:[id number]  DOB: 03/15/1966   DOA: 08/17/2021  Referring Physician: Satira Sark, MD  HPI: Jake Williams is a 55 y.o. male being followed for ventilator/airway/oxygen weaning Acute on Chronic Respiratory Failure.  Patient is afebrile without distress has been doing well with capping has a excellent strong cough  Medications: Reviewed on Rounds  Physical Exam:  Vitals: Temperature is 97.6 pulse 63 respiratory rate is 15 blood pressure is 168/99 saturations 97%  Ventilator Settings capping off the ventilator  General: Comfortable at this time Neck: supple Cardiovascular: no malignant arrhythmias Respiratory: No rhonchi no rales Skin: no rash seen on limited exam Musculoskeletal: No gross abnormality Psychiatric:unable to assess Neurologic:no involuntary movements         Lab Data:   Basic Metabolic Panel: Recent Labs  Lab 08/25/21 1442 08/27/21 0227 08/29/21 0742 09/01/21 0828  NA 133* 131* 133* 130*  K 4.1 4.4 4.1 4.4  CL 96* 92* 95* 92*  CO2 30 29 28 27   GLUCOSE 119* 104* 104* 116*  BUN 22* 45* 43* 53*  CREATININE 3.26* 5.28* 5.25* 6.21*  CALCIUM 9.4 10.3 10.5* 10.7*  PHOS 2.8 4.8* 4.9* 4.4    ABG: No results for input(s): PHART, PCO2ART, PO2ART, HCO3, O2SAT in the last 168 hours.  Liver Function Tests: Recent Labs  Lab 08/25/21 1442 08/27/21 0227 08/29/21 0742 09/01/21 0828  ALBUMIN 2.7* 2.7* 2.7* 2.8*   No results for input(s): LIPASE, AMYLASE in the last 168 hours. No results for input(s): AMMONIA in the last 168 hours.  CBC: Recent Labs  Lab 08/25/21 1442 08/27/21 0227 08/29/21 0741 09/01/21 0828  WBC 4.5 5.8 5.9 6.7  HGB 9.1* 8.9* 9.1* 9.4*  HCT 27.5* 27.2* 27.8* 28.2*  MCV 91.1 91.6 91.7 92.2  PLT 164 161 168 187    Cardiac Enzymes: No results for input(s): CKTOTAL, CKMB, CKMBINDEX,  TROPONINI in the last 168 hours.  BNP (last 3 results) No results for input(s): BNP in the last 8760 hours.  ProBNP (last 3 results) No results for input(s): PROBNP in the last 8760 hours.  Radiological Exams: No results found.  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)   Acute on chronic respiratory failure with hypoxia (HCC)   COPD, severe (HCC)   Seizure disorder (HCC)   Acute metabolic encephalopathy   Acute on chronic respiratory failure with hypoxia plan is to proceed to decannulate patient has met criteria for decannulation with improved cough ability to clear secretions Severe COPD medical management we will continue to follow along End-stage renal failure on hemodialysis Seizure disorder no signs of active seizures at this time Metabolic encephalopathy patient has been doing better   I have personally seen and evaluated the patient, evaluated laboratory and imaging results, formulated the assessment and plan and placed orders. The Patient requires high complexity decision making with multiple systems involvement.  Rounds were done with the Respiratory Therapy Director and Staff therapists and discussed with nursing staff also.  Allyne Gee, MD [facility] Pulmonary Critical Care Medicine Sleep Medicine

## 2021-09-02 NOTE — Progress Notes (Signed)
[facility]  ROUNDING NOTE   Subjective:  Patient seen at bedside. Now decannulated. Had dialysis yesterday.  Temperature 98 pulse 59 respiration 16 blood pressure 165/97   Objective:  Vital signs in last 24 hours:  Temperature 98 pulse 59 respirations 19 blood pressure 163/98   Physical Exam: General: No acute distress  Head: Normocephalic, atraumatic. Moist oral mucosal membranes  Eyes: Anicteric  Neck: Decannulated  Lungs:  Scattered rhonchi, normal effort  Heart: S1S2 no rubs  Abdomen:  Soft, nontender, bowel sounds present  Extremities: Trace peripheral edema.  Neurologic: Awake, alert, conversant  Skin: No acute rash  Access: Right IJ PermCath    Basic Metabolic Panel: Recent Labs  Lab 08/27/21 0227 08/29/21 0742 09/01/21 0828  NA 131* 133* 130*  K 4.4 4.1 4.4  CL 92* 95* 92*  CO2 29 28 27   GLUCOSE 104* 104* 116*  BUN 45* 43* 53*  CREATININE 5.28* 5.25* 6.21*  CALCIUM 10.3 10.5* 10.7*  PHOS 4.8* 4.9* 4.4     Liver Function Tests: Recent Labs  Lab 08/27/21 0227 08/29/21 0742 09/01/21 0828  ALBUMIN 2.7* 2.7* 2.8*    No results for input(s): LIPASE, AMYLASE in the last 168 hours. No results for input(s): AMMONIA in the last 168 hours.  CBC: Recent Labs  Lab 08/27/21 0227 08/29/21 0741 09/01/21 0828  WBC 5.8 5.9 6.7  HGB 8.9* 9.1* 9.4*  HCT 27.2* 27.8* 28.2*  MCV 91.6 91.7 92.2  PLT 161 168 187     Cardiac Enzymes: No results for input(s): CKTOTAL, CKMB, CKMBINDEX, TROPONINI in the last 168 hours.  BNP: Invalid input(s): POCBNP  CBG: No results for input(s): GLUCAP in the last 168 hours.  Microbiology: Results for orders placed or performed during the hospital encounter of 11/11/19  Respiratory Panel by RT PCR (Flu A&B, Covid) - Nasopharyngeal Swab     Status: Abnormal   Collection Time: 11/12/19 12:01 AM   Specimen: Nasopharyngeal Swab  Result Value Ref Range Status   SARS Coronavirus 2 by RT PCR POSITIVE (A)  NEGATIVE Final    Comment: RESULT CALLED TO, READ BACK BY AND VERIFIED WITH: H. MASHBURN,RN 0214 11/12/2019 T. TYSOR (NOTE) SARS-CoV-2 target nucleic acids are DETECTED. SARS-CoV-2 RNA is generally detectable in upper respiratory specimens  during the acute phase of infection. Positive results are indicative of the presence of the identified virus, but do not rule out bacterial infection or co-infection with other pathogens not detected by the test. Clinical correlation with patient history and other diagnostic information is necessary to determine patient infection status. The expected result is Negative. Fact Sheet for Patients:  PinkCheek.be Fact Sheet for Healthcare Providers: GravelBags.it This test is not yet approved or cleared by the [place] FDA and  has been authorized for detection and/or diagnosis of SARS-CoV-2 by FDA under an Emergency Use Authorization (EUA).  This EUA will remain in effect (meaning this test can be use d) for the duration of  the COVID-19 declaration under Section 564(b)(1) of the Act, 21 U.S.C. section 360bbb-3(b)(1), unless the authorization is terminated or revoked sooner.    Influenza A by PCR NEGATIVE NEGATIVE Final   Influenza B by PCR NEGATIVE NEGATIVE Final    Comment: (NOTE) The Xpert Xpress SARS-CoV-2/FLU/RSV assay is intended as an aid in  the diagnosis of influenza from Nasopharyngeal swab specimens and  should not be used as a sole basis for treatment. Nasal washings and  aspirates are unacceptable for Xpert Xpress SARS-CoV-2/FLU/RSV  testing. Fact Sheet  for Patients: PinkCheek.be Fact Sheet for Healthcare Providers: GravelBags.it This test is not yet approved or cleared by the [place] FDA and  has been authorized for detection and/or diagnosis of SARS-CoV-2 by  FDA under an Emergency Use Authorization (EUA).  This EUA will remain  in effect (meaning this test can be used) for the duration of the  Covid-19 declaration under Section 564(b)(1) of the Act, 21  U.S.C. section 360bbb-3(b)(1), unless the authorization is  terminated or revoked. Performed at [facility], [place] [address], [place], [place] [address]   Culture, blood (routine x 2)     Status: None   Collection Time: 11/12/19  3:38 AM   Specimen: BLOOD  Result Value Ref Range Status   Specimen Description BLOOD RIGHT ARM  Final   Special Requests   Final    BOTTLES DRAWN AEROBIC AND ANAEROBIC Blood Culture adequate volume Performed at [facility], [place] [address], [place], [place] [address]    Culture NO GROWTH 5 DAYS  Final   Report Status 11/17/2019 FINAL  Final  Culture, blood (routine x 2)     Status: None   Collection Time: 11/12/19  3:49 AM   Specimen: BLOOD  Result Value Ref Range Status   Specimen Description BLOOD RIGHT HAND  Final   Special Requests   Final    BOTTLES DRAWN AEROBIC AND ANAEROBIC Blood Culture adequate volume Performed at [facility], [place] [address], [place],  [address]    Culture NO GROWTH 5 DAYS  Final   Report Status 11/17/2019 FINAL  Final    Coagulation Studies: No results for input(s): LABPROT, INR in the last 72 hours.  Urinalysis: No results for input(s): COLORURINE, LABSPEC, PHURINE, GLUCOSEU, HGBUR, BILIRUBINUR, KETONESUR, PROTEINUR, UROBILINOGEN, NITRITE, LEUKOCYTESUR in the last 72 hours.  Invalid input(s): APPERANCEUR    Imaging: No results found.   Medications:       Assessment/ Plan:  55 y.o. 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   1.  ESRD on HD.  Patient due for hemodialysis treatment again tomorrow.  Had hemodialysis yesterday.  2.  Anemia of chronic kidney disease.  Hemoglobin up to 9.4.  Continue Retacrit [id number] with dialysis treatments.  3.  Secondary hyperparathyroidism.  Phosphorus acceptable at 4.4.  4.  Acute respiratory failure.  Patient now decannulated and doing well.   LOS: 0 Gisele Pack 12/7/202211:16 AM

## 2021-09-03 LAB — RENAL FUNCTION PANEL
Albumin: 2.9 g/dL — ABNORMAL LOW (ref 3.5–5.0)
Anion gap: 11 (ref 5–15)
BUN: 49 mg/dL — ABNORMAL HIGH (ref 6–20)
CO2: 26 mmol/L (ref 22–32)
Calcium: 10.6 mg/dL — ABNORMAL HIGH (ref 8.9–10.3)
Chloride: 92 mmol/L — ABNORMAL LOW (ref 98–111)
Creatinine, Ser: 5.94 mg/dL — ABNORMAL HIGH (ref 0.61–1.24)
GFR, Estimated: 11 mL/min — ABNORMAL LOW (ref >60)
Glucose, Bld: 108 mg/dL — ABNORMAL HIGH (ref 70–99)
Phosphorus: 4.1 mg/dL (ref 2.5–4.6)
Potassium: 4.3 mmol/L (ref 3.5–5.1)
Sodium: 129 mmol/L — ABNORMAL LOW (ref 135–145)

## 2021-09-03 LAB — CBC
HCT: 27.7 % — ABNORMAL LOW (ref 39.0–52.0)
Hemoglobin: 8.9 g/dL — ABNORMAL LOW (ref 13.0–17.0)
MCH: 30.2 pg (ref 26.0–34.0)
MCHC: 32.1 g/dL (ref 30.0–36.0)
MCV: 93.9 fL (ref 80.0–100.0)
Platelets: 188 10*3/uL (ref 150–400)
RBC: 2.95 MIL/uL — ABNORMAL LOW (ref 4.22–5.81)
RDW: 17.5 % — ABNORMAL HIGH (ref 11.5–15.5)
WBC: 6.9 10*3/uL (ref 4.0–10.5)
nRBC: 0 % (ref 0.0–0.2)

## 2021-09-04 NOTE — Progress Notes (Signed)
[facility]  ROUNDING NOTE   Subjective:  Patient seen and evaluated bedside. Resting comfortably. In good spirits. Inquires as to when he will be discharged.      Objective:  Vital signs in last 24 hours:  Temperature 98.3 pulse 61 respiration 16 blood pressure 163/97.   Physical Exam: General: No acute distress  Head: Normocephalic, atraumatic. Moist oral mucosal membranes  Eyes: Anicteric  Neck: Decannulated  Lungs:  Scattered rhonchi, normal effort  Heart: S1S2 no rubs  Abdomen:  Soft, nontender, bowel sounds present  Extremities: Trace peripheral edema.  Neurologic: Awake, alert, conversant  Skin: No acute rash  Access: Right IJ PermCath    Basic Metabolic Panel: Recent Labs  Lab 08/29/21 0742 09/01/21 0828 09/03/21 0730  NA 133* 130* 129*  K 4.1 4.4 4.3  CL 95* 92* 92*  CO2 28 27 26   GLUCOSE 104* 116* 108*  BUN 43* 53* 49*  CREATININE 5.25* 6.21* 5.94*  CALCIUM 10.5* 10.7* 10.6*  PHOS 4.9* 4.4 4.1     Liver Function Tests: Recent Labs  Lab 08/29/21 0742 09/01/21 0828 09/03/21 0730  ALBUMIN 2.7* 2.8* 2.9*    No results for input(s): LIPASE, AMYLASE in the last 168 hours. No results for input(s): AMMONIA in the last 168 hours.  CBC: Recent Labs  Lab 08/29/21 0741 09/01/21 0828 09/03/21 0730  WBC 5.9 6.7 6.9  HGB 9.1* 9.4* 8.9*  HCT 27.8* 28.2* 27.7*  MCV 91.7 92.2 93.9  PLT 168 187 188     Cardiac Enzymes: No results for input(s): CKTOTAL, CKMB, CKMBINDEX, TROPONINI in the last 168 hours.  BNP: Invalid input(s): POCBNP  CBG: No results for input(s): GLUCAP in the last 168 hours.  Microbiology: Results for orders placed or performed during the hospital encounter of 11/11/19  Respiratory Panel by RT PCR (Flu A&B, Covid) - Nasopharyngeal Swab     Status: Abnormal   Collection Time: 11/12/19 12:01 AM   Specimen: Nasopharyngeal Swab  Result Value Ref Range Status   SARS Coronavirus 2 by RT PCR POSITIVE (A) NEGATIVE  Final    Comment: RESULT CALLED TO, READ BACK BY AND VERIFIED WITH: H. MASHBURN,RN 0214 11/12/2019 T. TYSOR (NOTE) SARS-CoV-2 target nucleic acids are DETECTED. SARS-CoV-2 RNA is generally detectable in upper respiratory specimens  during the acute phase of infection. Positive results are indicative of the presence of the identified virus, but do not rule out bacterial infection or co-infection with other pathogens not detected by the test. Clinical correlation with patient history and other diagnostic information is necessary to determine patient infection status. The expected result is Negative. Fact Sheet for Patients:  PinkCheek.be Fact Sheet for Healthcare Providers: GravelBags.it This test is not yet approved or cleared by the [place] FDA and  has been authorized for detection and/or diagnosis of SARS-CoV-2 by FDA under an Emergency Use Authorization (EUA).  This EUA will remain in effect (meaning this test can be use d) for the duration of  the COVID-19 declaration under Section 564(b)(1) of the Act, 21 U.S.C. section 360bbb-3(b)(1), unless the authorization is terminated or revoked sooner.    Influenza A by PCR NEGATIVE NEGATIVE Final   Influenza B by PCR NEGATIVE NEGATIVE Final    Comment: (NOTE) The Xpert Xpress SARS-CoV-2/FLU/RSV assay is intended as an aid in  the diagnosis of influenza from Nasopharyngeal swab specimens and  should not be used as a sole basis for treatment. Nasal washings and  aspirates are unacceptable for Xpert Xpress SARS-CoV-2/FLU/RSV  testing.  Fact Sheet for Patients: PinkCheek.be Fact Sheet for Healthcare Providers: GravelBags.it This test is not yet approved or cleared by the [place] FDA and  has been authorized for detection and/or diagnosis of SARS-CoV-2 by  FDA under an Emergency Use Authorization (EUA). This EUA  will remain  in effect (meaning this test can be used) for the duration of the  Covid-19 declaration under Section 564(b)(1) of the Act, 21  U.S.C. section 360bbb-3(b)(1), unless the authorization is  terminated or revoked. Performed at [facility], [place] [address], [place], [place] [address]   Culture, blood (routine x 2)     Status: None   Collection Time: 11/12/19  3:38 AM   Specimen: BLOOD  Result Value Ref Range Status   Specimen Description BLOOD RIGHT ARM  Final   Special Requests   Final    BOTTLES DRAWN AEROBIC AND ANAEROBIC Blood Culture adequate volume Performed at [facility], [place] [address] [place], [place] [address]    Culture NO GROWTH 5 DAYS  Final   Report Status 11/17/2019 FINAL  Final  Culture, blood (routine x 2)     Status: None   Collection Time: 11/12/19  3:49 AM   Specimen: BLOOD  Result Value Ref Range Status   Specimen Description BLOOD RIGHT HAND  Final   Special Requests   Final    BOTTLES DRAWN AEROBIC AND ANAEROBIC Blood Culture adequate volume Performed at [facility], [place] [address] [place], [place] [address]    Culture NO GROWTH 5 DAYS  Final   Report Status 11/17/2019 FINAL  Final    Coagulation Studies: No results for input(s): LABPROT, INR in the last 72 hours.  Urinalysis: No results for input(s): COLORURINE, LABSPEC, PHURINE, GLUCOSEU, HGBUR, BILIRUBINUR, KETONESUR, PROTEINUR, UROBILINOGEN, NITRITE, LEUKOCYTESUR in the last 72 hours.  Invalid input(s): APPERANCEUR    Imaging: No results found.   Medications:       Assessment/ Plan:  55 y.o. 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   1.  ESRD on HD.  Patient had hemodialysis treatment yesterday.  No acute indication for dialysis treatment today.  We will plan for dialysis again tomorrow.  2.  Anemia of chronic kidney disease.  Hemoglobin currently 8.9.  Continue Retacrit 6000 units IV with dialysis treatments.  3.  Secondary hyperparathyroidism.  Phosphorus within goal range at 4.1.  Continue Parikh to monitor bone mineral metabolism parameters.  4.  Acute respiratory failure.  Patient now decannulated and doing well.   LOS: 0 Azusena Erlandson 12/9/20228:35 AM

## 2021-09-05 LAB — RENAL FUNCTION PANEL
Albumin: 2.8 g/dL — ABNORMAL LOW (ref 3.5–5.0)
Anion gap: 10 (ref 5–15)
BUN: 50 mg/dL — ABNORMAL HIGH (ref 6–20)
CO2: 25 mmol/L (ref 22–32)
Calcium: 10.6 mg/dL — ABNORMAL HIGH (ref 8.9–10.3)
Chloride: 95 mmol/L — ABNORMAL LOW (ref 98–111)
Creatinine, Ser: 5.24 mg/dL — ABNORMAL HIGH (ref 0.61–1.24)
GFR, Estimated: 12 mL/min — ABNORMAL LOW (ref >60)
Glucose, Bld: 112 mg/dL — ABNORMAL HIGH (ref 70–99)
Phosphorus: 3.5 mg/dL (ref 2.5–4.6)
Potassium: 4.1 mmol/L (ref 3.5–5.1)
Sodium: 130 mmol/L — ABNORMAL LOW (ref 135–145)

## 2021-09-05 LAB — CBC
HCT: 29.2 % — ABNORMAL LOW (ref 39.0–52.0)
Hemoglobin: 9.6 g/dL — ABNORMAL LOW (ref 13.0–17.0)
MCH: 30.6 pg (ref 26.0–34.0)
MCHC: 32.9 g/dL (ref 30.0–36.0)
MCV: 93 fL (ref 80.0–100.0)
Platelets: 173 10*3/uL (ref 150–400)
RBC: 3.14 MIL/uL — ABNORMAL LOW (ref 4.22–5.81)
RDW: 17.9 % — ABNORMAL HIGH (ref 11.5–15.5)
WBC: 6.8 10*3/uL (ref 4.0–10.5)
nRBC: 0 % (ref 0.0–0.2)

## 2021-09-07 NOTE — Progress Notes (Signed)
[facility]  ROUNDING NOTE   Subjective:  Patient awake and alert this AM. Currently eating breakfast. In good spirits. Awaiting transfer to inpatient rehabilitation center.    Objective:  Vital signs in last 24 hours:  Temperature 97 pulse 62 respiration 17 blood pressure 175/98   Physical Exam: General: No acute distress  Head: Normocephalic, atraumatic. Moist oral mucosal membranes  Eyes: Anicteric  Neck: Decannulated  Lungs:  Scattered rhonchi, normal effort  Heart: S1S2 no rubs  Abdomen:  Soft, nontender, bowel sounds present  Extremities: No peripheral edema.  Neurologic: Awake, alert, conversant  Skin: No acute rash  Access: Right IJ PermCath    Basic Metabolic Panel: Recent Labs  Lab 09/01/21 0828 09/03/21 0730 09/05/21 0746  NA 130* 129* 130*  K 4.4 4.3 4.1  CL 92* 92* 95*  CO2 27 26 25   GLUCOSE 116* 108* 112*  BUN 53* 49* 50*  CREATININE 6.21* 5.94* 5.24*  CALCIUM 10.7* 10.6* 10.6*  PHOS 4.4 4.1 3.5     Liver Function Tests: Recent Labs  Lab 09/01/21 0828 09/03/21 0730 09/05/21 0746  ALBUMIN 2.8* 2.9* 2.8*    No results for input(s): LIPASE, AMYLASE in the last 168 hours. No results for input(s): AMMONIA in the last 168 hours.  CBC: Recent Labs  Lab 09/01/21 0828 09/03/21 0730 09/05/21 0746  WBC 6.7 6.9 6.8  HGB 9.4* 8.9* 9.6*  HCT 28.2* 27.7* 29.2*  MCV 92.2 93.9 93.0  PLT 187 188 173     Cardiac Enzymes: No results for input(s): CKTOTAL, CKMB, CKMBINDEX, TROPONINI in the last 168 hours.  BNP: Invalid input(s): POCBNP  CBG: No results for input(s): GLUCAP in the last 168 hours.  Microbiology: Results for orders placed or performed during the hospital encounter of 11/11/19  Respiratory Panel by RT PCR (Flu A&B, Covid) - Nasopharyngeal Swab     Status: Abnormal   Collection Time: 11/12/19 12:01 AM   Specimen: Nasopharyngeal Swab  Result Value Ref Range Status   SARS Coronavirus 2 by RT PCR POSITIVE (A)  NEGATIVE Final    Comment: RESULT CALLED TO, READ BACK BY AND VERIFIED WITH: H. MASHBURN,RN 0214 11/12/2019 T. TYSOR (NOTE) SARS-CoV-2 target nucleic acids are DETECTED. SARS-CoV-2 RNA is generally detectable in upper respiratory specimens  during the acute phase of infection. Positive results are indicative of the presence of the identified virus, but do not rule out bacterial infection or co-infection with other pathogens not detected by the test. Clinical correlation with patient history and other diagnostic information is necessary to determine patient infection status. The expected result is Negative. Fact Sheet for Patients:  PinkCheek.be Fact Sheet for Healthcare Providers: GravelBags.it This test is not yet approved or cleared by the [place] FDA and  has been authorized for detection and/or diagnosis of SARS-CoV-2 by FDA under an Emergency Use Authorization (EUA).  This EUA will remain in effect (meaning this test can be use d) for the duration of  the COVID-19 declaration under Section 564(b)(1) of the Act, 21 U.S.C. section 360bbb-3(b)(1), unless the authorization is terminated or revoked sooner.    Influenza A by PCR NEGATIVE NEGATIVE Final   Influenza B by PCR NEGATIVE NEGATIVE Final    Comment: (NOTE) The Xpert Xpress SARS-CoV-2/FLU/RSV assay is intended as an aid in  the diagnosis of influenza from Nasopharyngeal swab specimens and  should not be used as a sole basis for treatment. Nasal washings and  aspirates are unacceptable for Xpert Xpress SARS-CoV-2/FLU/RSV  testing. Fact Sheet  for Patients: PinkCheek.be Fact Sheet for Healthcare Providers: GravelBags.it This test is not yet approved or cleared by the [place] FDA and  has been authorized for detection and/or diagnosis of SARS-CoV-2 by  FDA under an Emergency Use Authorization (EUA).  This EUA will remain  in effect (meaning this test can be used) for the duration of the  Covid-19 declaration under Section 564(b)(1) of the Act, 21  U.S.C. section 360bbb-3(b)(1), unless the authorization is  terminated or revoked. Performed at [facility], [place] [address] [place], [place] [address]   Culture, blood (routine x 2)     Status: None   Collection Time: 11/12/19  3:38 AM   Specimen: BLOOD  Result Value Ref Range Status   Specimen Description BLOOD RIGHT ARM  Final   Special Requests   Final    BOTTLES DRAWN AEROBIC AND ANAEROBIC Blood Culture adequate volume Performed at [facility], [place] [address], [place], [place] [address]    Culture NO GROWTH 5 DAYS  Final   Report Status 11/17/2019 FINAL  Final  Culture, blood (routine x 2)     Status: None   Collection Time: 11/12/19  3:49 AM   Specimen: BLOOD  Result Value Ref Range Status   Specimen Description BLOOD RIGHT HAND  Final   Special Requests   Final    BOTTLES DRAWN AEROBIC AND ANAEROBIC Blood Culture adequate volume Performed at [facility], [place] [address], [place], [place] [address]    Culture NO GROWTH 5 DAYS  Final   Report Status 11/17/2019 FINAL  Final    Coagulation Studies: No results for input(s): LABPROT, INR in the last 72 hours.  Urinalysis: No results for input(s): COLORURINE, LABSPEC, PHURINE, GLUCOSEU, HGBUR, BILIRUBINUR, KETONESUR, PROTEINUR, UROBILINOGEN, NITRITE, LEUKOCYTESUR in the last 72 hours.  Invalid input(s): APPERANCEUR    Imaging: No results found.   Medications:       Assessment/ Plan:  55 y.o. 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   1.  ESRD on HD.  Patient seen and evaluated at bedside this AM.  No immediate need for dialysis.  We will plan for dialysis again tomorrow per usual schedule.  2.  Anemia of chronic kidney disease.  Hemoglobin up to 9.6.  Maintain the patient on Retacrit 6000 units IV with dialysis.  3.  Secondary hyperparathyroidism.  Phosphorus at target at 3.5.  Continue to monitor.  4.  Acute respiratory failure.  Patient continues to do well with decannulation.   LOS: 0 Braidyn Scorsone 12/12/20228:04 AM

## 2021-09-08 LAB — RENAL FUNCTION PANEL
Albumin: 2.8 g/dL — ABNORMAL LOW (ref 3.5–5.0)
Anion gap: 10 (ref 5–15)
BUN: 72 mg/dL — ABNORMAL HIGH (ref 6–20)
CO2: 26 mmol/L (ref 22–32)
Calcium: 10.6 mg/dL — ABNORMAL HIGH (ref 8.9–10.3)
Chloride: 95 mmol/L — ABNORMAL LOW (ref 98–111)
Creatinine, Ser: 7.21 mg/dL — ABNORMAL HIGH (ref 0.61–1.24)
GFR, Estimated: 8 mL/min — ABNORMAL LOW (ref >60)
Glucose, Bld: 105 mg/dL — ABNORMAL HIGH (ref 70–99)
Phosphorus: 5.1 mg/dL — ABNORMAL HIGH (ref 2.5–4.6)
Potassium: 4.4 mmol/L (ref 3.5–5.1)
Sodium: 131 mmol/L — ABNORMAL LOW (ref 135–145)

## 2021-09-08 LAB — CBC
HCT: 28.5 % — ABNORMAL LOW (ref 39.0–52.0)
Hemoglobin: 9.4 g/dL — ABNORMAL LOW (ref 13.0–17.0)
MCH: 30.6 pg (ref 26.0–34.0)
MCHC: 33 g/dL (ref 30.0–36.0)
MCV: 92.8 fL (ref 80.0–100.0)
Platelets: 167 10*3/uL (ref 150–400)
RBC: 3.07 MIL/uL — ABNORMAL LOW (ref 4.22–5.81)
RDW: 18.2 % — ABNORMAL HIGH (ref 11.5–15.5)
WBC: 7.1 10*3/uL (ref 4.0–10.5)
nRBC: 0 % (ref 0.0–0.2)

## 2022-05-31 IMAGING — DX DG CHEST 1V PORT
1 series · 2 of 2 positions shown · non-contrast
Comparison: Chest radiograph 07/30/2021

CLINICAL DATA: Respiratory failure.  Gastrostomy tube placement.

EXAM:
PORTABLE CHEST 1 VIEW

[Series 1: chest ap · 0.14mm/px · 2 of 2 slices shown]
[im 1/2]
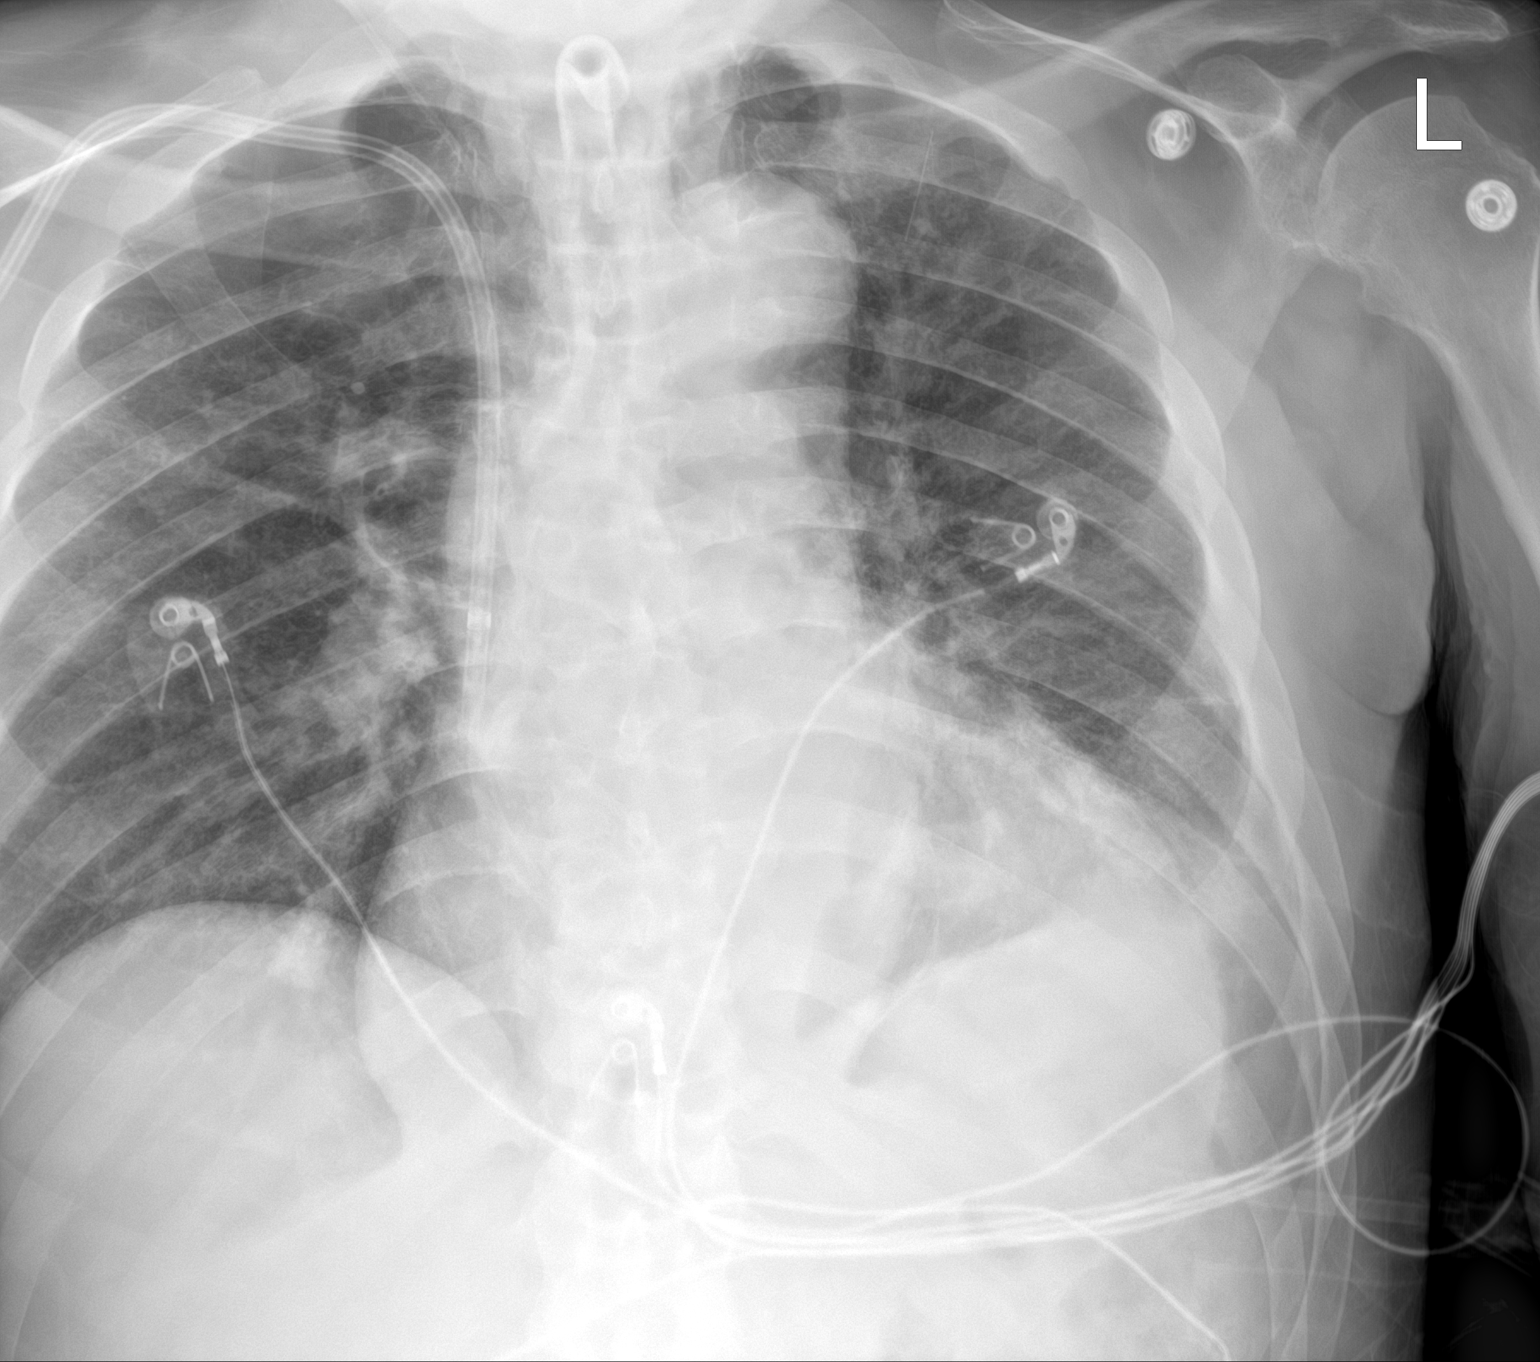
[im 2/2]
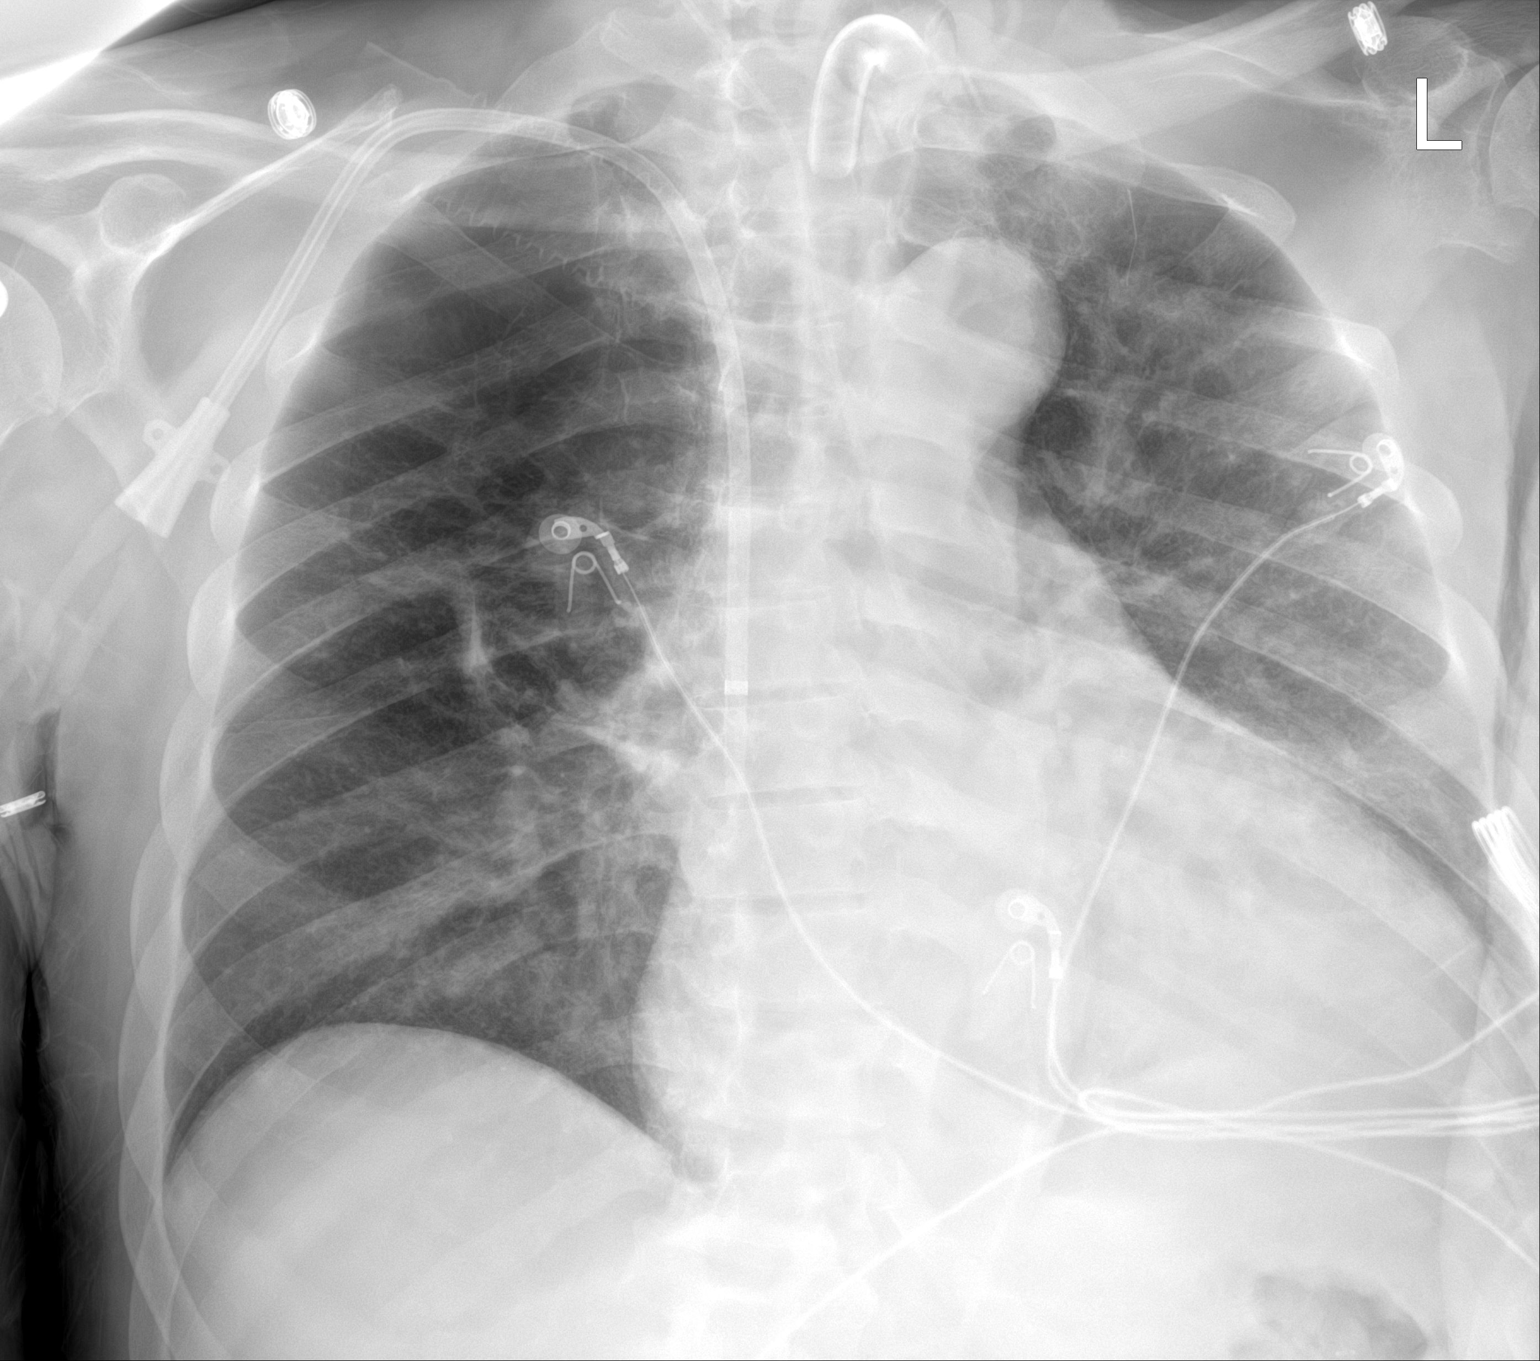

[2 of 2 positions shown; findings below may reference images not displayed]

FINDINGS: Tracheostomy tube tip at the thoracic inlet. Right-sided dialysis
catheter tip overlies the SVC. Patient is rotated. The heart is
enlarged, increased from prior exam. Stable mediastinal contours.
Emphysema with paucity of lung markings at the right lung apex.
Catheter tubing is seen projecting over the right and central chest,
difficult to delineate distal portion. Increased retrocardiac
opacity. Bronchial thickening. No pneumothorax. Slight blunting of
left costophrenic angle.
IMPRESSION: 1. Tracheostomy tube tip at the thoracic inlet. Right-sided dialysis
catheter with tip over the SVC.
2. Increased retrocardiac opacity, atelectasis versus pneumonia.
Possible small left pleural effusion.
3. Emphysema.

## 2022-05-31 IMAGING — DX DG ABD PORTABLE 1V
1 series · 1 of 1 positions shown · non-contrast
Comparison: None.

CLINICAL DATA: Gastrostomy tube placement.

EXAM:
PORTABLE ABDOMEN - 1 VIEW

[abdomen supine]
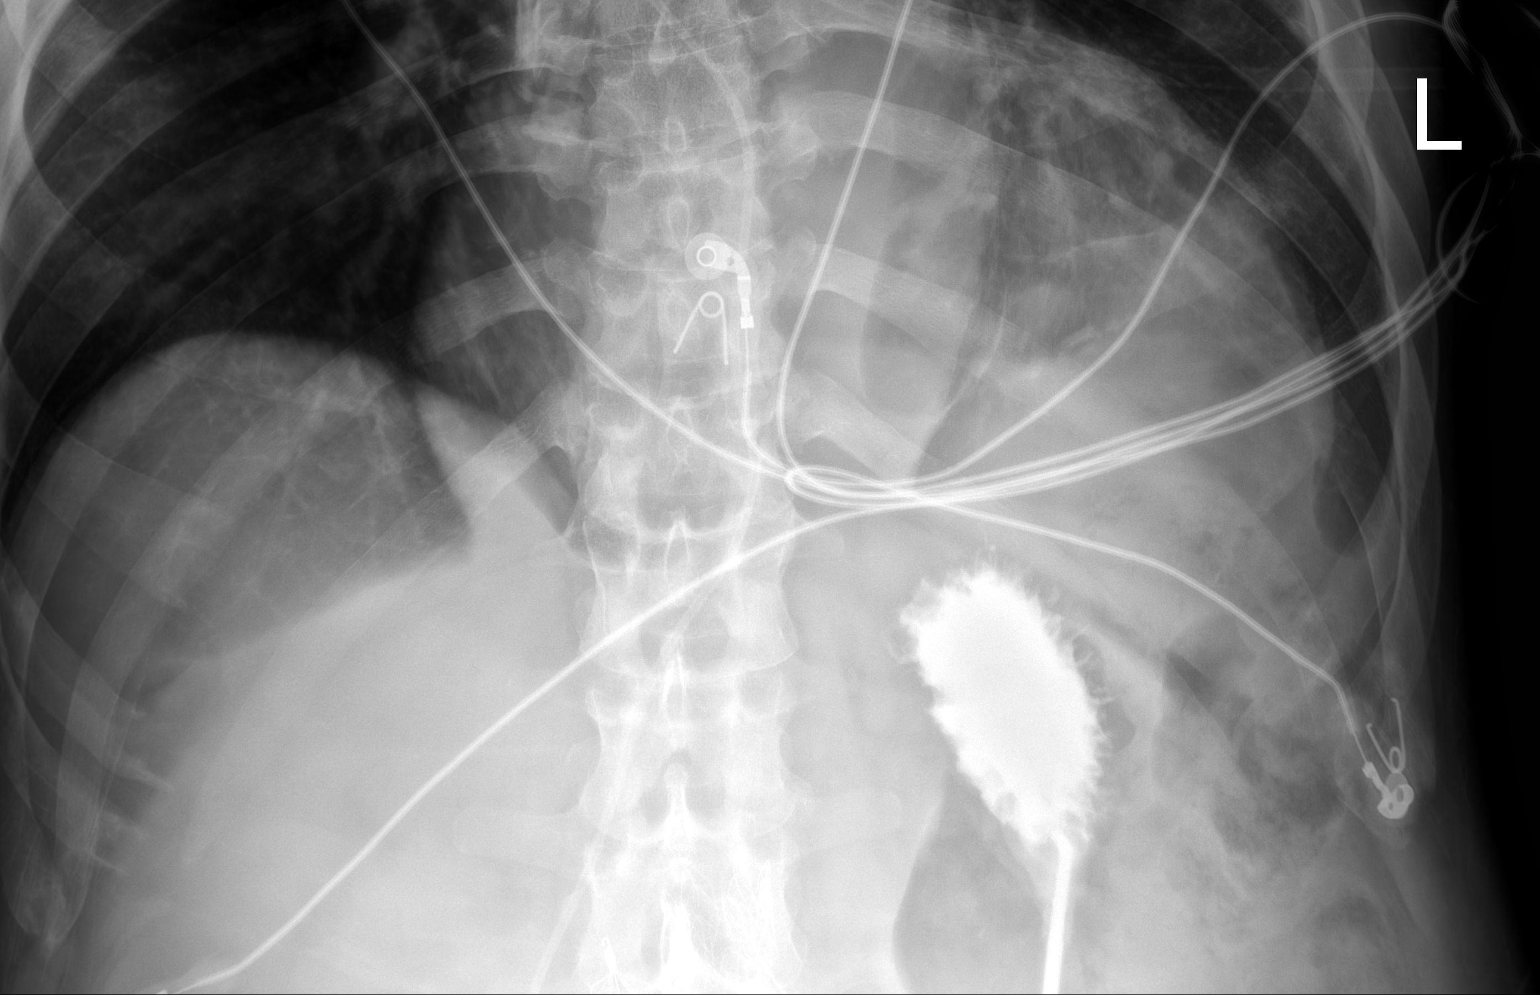

[1 of 1 positions shown; findings below may reference images not displayed]

FINDINGS: Single frontal view of the upper abdomen obtained after the
installation of 30 cc Gastrografin through indwelling gastrostomy
tube. Contrast opacifies the gastrostomy tubing and portions of the
stomach. There is no evidence of extravasation or leak. Distal
stomach and duodenum are not included in the field of view. IVC
filter and aortic stent graft are partially included.
IMPRESSION: Gastrostomy tube in the stomach. No evidence of extravasation or
leak.

## 2023-01-07 ENCOUNTER — Other Ambulatory Visit: Payer: Self-pay | Admitting: *Deleted

## 2023-01-07 DIAGNOSIS — N186 End stage renal disease: Secondary | ICD-10-CM

## 2023-01-18 ENCOUNTER — Ambulatory Visit (HOSPITAL_COMMUNITY): Payer: 59

## 2023-01-18 ENCOUNTER — Encounter: Payer: 59 | Admitting: Vascular Surgery

## 2023-01-18 ENCOUNTER — Ambulatory Visit (HOSPITAL_COMMUNITY): Payer: 59 | Attending: Vascular Surgery

## 2023-03-21 ENCOUNTER — Encounter: Payer: Self-pay | Admitting: Vascular Surgery

## 2024-04-02 ENCOUNTER — Other Ambulatory Visit: Payer: Self-pay | Admitting: *Deleted

## 2024-04-02 ENCOUNTER — Other Ambulatory Visit: Payer: Self-pay | Admitting: Family

## 2024-04-02 DIAGNOSIS — D649 Anemia, unspecified: Secondary | ICD-10-CM

## 2024-04-02 DIAGNOSIS — D61818 Other pancytopenia: Secondary | ICD-10-CM

## 2024-04-03 ENCOUNTER — Inpatient Hospital Stay: Admitting: Family

## 2024-04-03 ENCOUNTER — Inpatient Hospital Stay: Attending: Hematology & Oncology

## 2024-04-03 ENCOUNTER — Telehealth: Payer: Self-pay | Admitting: Family

## 2024-04-03 NOTE — Telephone Encounter (Signed)
 unable to lvm to advise pt to return call for scheduling missed NP appt.

## 2024-04-08 NOTE — Discharge Summary (Signed)
 ------------------------------------------------------------------------------- Attestation signed by Glo Stanly Oris, MD at 04/12/2024  2:31 PM Agreed with discharge plan as mentioned below by Lauraine Olivia Critchley, PA-C . I have not seen or examined the pt, but was available if PA had any question. charges per PA.  -------------------------------------------------------------------------------  Hospitalist  Discharge Summary   Name: Jake Williams Age: 58 yrs  MRN: [id number] DOB: Nov 26, 1965  Admit Date: 04/06/2024 Admitting Physician: No admitting provider for patient encounter.  Discharge Date: 04/08/2024 Discharge Physician: Lauraine Olivia Critchley, PA-C, Glo Stanly Oris, MD   Admission Diagnosis:   Leg swelling [M79.89] Bilateral leg edema [R60.0]   Discharge Diagnoses:   Principal Problem:   Edema of right upper arm Resolved Problems:   * No resolved hospital problems. *   TO DO List at Follow-up for PCP/Specialist:   Key Medication changes: none Pending labs to follow up on: none Incidental findings requiring follow-up: none Other:  Resume typical HD schedule on Mon 04/09/24 Ace wrap and elevation to R arm    Hospital Course:   For full details, please see H&P, progress notes, consult notes and ancillary notes.   Briefly, Jake Williams is a 58 y.o. 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    A duplex was obtained that showed no DVT or superficial thrombosis, but did note slow flow in RIJ with mild dilation and possibility of reduced flow  through SVC but cannot be seen with this study. Based on these findings, case was discussed with Vascular Surgery. They rec'd CT chest venous phased to eval for potential SVC thrombus as etiology of swelling. 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 VascSurg noted this occurs with long term cath use and no need for intervention from their standpoint given cath is working. They rec'd gentle compression w ace wrap and elevation.   He had no acute hospital course complications. Given medical stability, pt dc'd to home in the care of his sister on 7/13. He will resume his normal HD scheduled MWF with next HD on 7/14.      The patient's detailed hospital course will be summarized in a problem based approach below.   Right upper extremity swelling Known right internal jugular DVT RIJ DVT recently diagnosed March 02, 2024, started on Eliquis. Has PC in right chest Has extensive vasculopath Hx  BNP 374 (lower than prior) Last TTE 06/2022  He is right hand dominant PLAN -Continued home Eliquis -RUE venous Doppler 7/12 no DVT or superficial thrombosis, but did note slow flow in RIJ with mild dilation and possibility of reduced flow through SVC but cannot be seen with this study  -Discussed with VascSurg -CT chest venous phased to eval for potential SVC thrombus. Showed a 1.4 cm stenotic segment of R brachiocephalic vein around the catheter just below the confluence of the R subclavian vein and R internal jugular vein.  -VascSurg noted this occurs with long term cath use and no need for intervention from  their standpoint given cath is working. They rec'd gentle compression w ace wrap and elevation. IR could consider moving PC to other side, but sometimes the vein fully occludes and pt has the same symptoms.     Hypokalemia, mild - RESOLVED Resolved s/p gentle repletion via PO  (took caution with ESRD/HD)     Hypertensive urgency - IMPROVED PMHx HTN. On multiple antihypertensives   BP improved on home antihypertensives and s/p HD PLAN -Continued home: amlodipine , clonidine, hydralazine , labetalol, lisinopril            Chronic medical problems:   HTN, HFpEF, PFO Continued home meds   ESRD on HD MWF No missed HD sessions PTA Nephrology involved. Underwent HD 7/11. Next HD planned for Mon 7/14.    Severe COPD Chronic hypoxic respiratory failure on 2L Currently on RA No wheeze on exam - no evidence of COPDE   Hx ruptured cerebral aneurysm 2010 Extensive intraventricular hemorrhage s/p shunt placement Large SDH s/p bur hole evacuation in 2023 Moderate cognitive impairment Sister reports pt intermittently confused at baseline, sometimes gets days wrong  Pt AAOx3-4 during hospitalization, but notably a poor historian. Follows commands.    BPH Reports producing UOP despite ESRD/HD and voiding WO issue    Chronic Hepatitis B Chronic pancytopenia   Infrarenal AAA dissection s/p graft repair & revision of endoleak 2021   PE s/p IVC filter 2014 Continued Eliquis       The patient's chronic medical conditions were treated accordingly per the patient's home medication regimen except as noted in the plan above and in the medication list below.    Discharge Condition:   Disposition: Patient discharged to Home or Self Care in stable condition.  Diet at discharge: Adult Diet- Renal; Dialysis (K/Phos/Na restricted)  Activity at Discharge: Ambulate ad lib  Wound/Incision Care Instructions: NA  Wound 03/07/24 Skin Tear Knee Anterior;Right (Active)  Date First Assessed/Time First Assessed: 03/07/24 1617   Pre-Existing Wound: No  Primary Wound Type: Skin Tear  Location: Knee  Wound Location Orientation: Anterior;Right     Subjective / Interim Hx / Physical Exam at Discharge  7/13: NAEON, afebrile BP on high side, on RA. R arm duplex completed -  checking w VascSurg if any additional insight/imaging/recs. Conts on home eliquis. Nephro involved.   Comfortably watching TV. AAOx4 (mild difficulty with time). Questionable historian w memory deficits. No family present. Feels his RUE edema is stable from day prior. Noted to have mild pain w R forearm palpation. He otherwise feels well & pertinent ROS negative. Eating well, +UOP w ESRD/HD, reports +BM. Discussed POC; denies any Qs.      Attempted to call sister Versa) to provide an update on POC and dispo. No answer. Left brief HIPAA-compliant VM with update. Call back # left.    BP (!) 162/99 (BP Location: Right arm, Patient Position: Lying)   Pulse 64   Temp 97.5 F (36.4 C) (Oral)   Resp 17   Ht 1.854 m (6' 0.99)   Wt 87.3 kg (192 lb 7.4 oz)   SpO2 97%   BMI 25.40 kg/m   General: NAD, pleasant and cooperative HEENT: NCAT, EOMI, MMM CV: RRR, no m/r/g, PC in R chest  RESP: CTAB anterior fields, + air movement, on RA GI: +BS, SNTND GU: no suprapubic tenderness EXT: WWP, no cyanosis, RUE swollen but nonpitting, R fingers not swollen, R forearm with TTP MSK: normal ROM, moving all extremities spontaneously  Skin: warm and dry, intact, normal color Neuro: AAOx (  difficulty w time), no apparent focal deficits but notably forgetful/poor historian, follows commands, appropriately conversive  Psych: appropriate affect, mood stable  Discharge Medications:      Medication List     CHANGE how you take these medications    hydrALAZINE  100 mg tablet Commonly known as: APRESOLINE  Take 100 mg by mouth 3 (three) times a day. What changed: Another medication with the same name was removed. Continue taking this medication, and follow the directions you see here.       CONTINUE taking these medications    acetaminophen  500 mg tablet Commonly known as: TYLENOL  Take 500 mg by mouth every 6 (six) hours as needed.   albuterol  2.5 mg /3 mL (0.083 %) nebulizer solution Take 2.5 mg  by nebulization every 4 (four) hours as needed for wheezing or shortness of breath.   amantadine 100 mg capsule Commonly known as: SYMMETREL TAKE TWO CAPSULES BY MOUTH EVERY WEEK ON MONDAY   amLODIPine  10 mg tablet Commonly known as: NORVASC  TAKE ONE TABLET BY MOUTH EVERY MORNING   apixaban 5 mg Tab Commonly known as: ELIQUIS Take 2 tablets (10 mg total) by mouth twice daily through evening of 03/15/24. Thereafter starting morning of 03/16/24 take 1 tablet (5 mg total) by mouth twice daily.   blood pressure monitor Kit Use as instructed to check blood pressure daily.   cloNIDine 0.1 mg tablet Commonly known as: CATAPRES TAKE ONE TABLET BY MOUTH THREE TIMES A DAY   docusate sodium 100 mg capsule Commonly known as: COLACE Take 100 mg by mouth daily as needed.   folic acid 400 mcg tablet Commonly known as: FOLVITE Take 1 tablet (400 mcg total) by mouth daily.   freestyle 28 gauge Misc 1 Units by miscellaneous route Once Daily.   FreeStyle Test test strip Generic drug: glucose blood Use as instructed.   glucose monitoring kit Kit Use as instructed   ipratropium-albuteroL  0.5-2.5 mg/3 mL nebulizer solution Commonly known as: DUO-NEB Take 3 mL by nebulization every 6 (six) hours as needed for wheezing.   labetaloL 300 mg tablet Commonly known as: NORMODYNE TAKE ONE TABLET BY MOUTH TWICE A DAY   lisinopriL  40 mg tablet Commonly known as: PRINIVIL  TAKE ONE TABLET BY MOUTH EVERY MORNING   polyvinyl alcohol 1.4 % ophthalmic solution Commonly known as: LIQUIFILM TEARS Administer 1 drop into each eyes as needed.   Sensipar 30 mg tablet Generic drug: cinacalcet Take 1 tablet by mouth.   Trelegy Ellipta 100-62.5-25 mcg inhaler Generic drug: fluticasone-umeclidinium-vilanterol Inhale 1 puff Once Daily.        Significant Diagnostic Tests:   LABS:  Lab Results  Component Value Date   WBC 3.70 (L) 04/07/2024   HGB 10.1 (L) 04/07/2024   HCT 29.7 (L) 04/07/2024    PLT 89 (L) 04/07/2024   CHOL 116 06/14/2023   TRIG 76 06/14/2023   HDL 51 (L) 06/14/2023   LDLDIRECT 53 10/04/2020   ALT 4 (L) 04/07/2024   AST 7 (L) 04/07/2024   NA 132 (L) 04/08/2024   K 3.9 04/08/2024   CL 98 04/08/2024   CREATININE 5.05 (H) 04/08/2024   BUN 12 04/08/2024   CO2 31 04/08/2024   TSH 1.513 03/04/2024   PSA 0.81 01/15/2024   PTT 93.3 (H) 03/08/2024   INR 1.0 03/06/2024   HGBA1C 4.5 07/02/2022   HGBA1C 4.5 07/02/2022   IMAGING:  US  Peripheral Venous Arm Unilat Right  Final Result by Alverna Lamar Sieving, DO (07/12 1114)  [facility]                                                  [facility]                                                  [facility]                                                   Vascular                                                   [address]                                                  [place]                                                   [place] [address]                                Upper Extremity  Venous Ultrasound Report  Name  JUNAH, YAM             Study Date  04-06-2024 04 29 PM  MRN  [id number]                       Patient Location  [facility]  DOB  07/14/1966                     Gender  Male                                  Height  73 in  Age  43 yrs                         Ethnicity  3                                  Weight  188 lb  Ordering Physician  ALEXIS MARCE REVELS  Referring Physician  ALEXIS MARCE REVELS  Performed By  ELIZABETHANN JANSKY  Interpretation Summary  This patient has a permcath. The venous duplex examination of the right   upper  extremity shows no DVT  or superficial venous thrombosis. The right jugular vein appears to have   Rouleaux  slow  flow and is  dilated. There may be reduced flow through the superior vena cava due to   stenosis, SVC thrombus etc.  that cannot be seen with this test. Clinical correlation is advised and   additional imaging may be  indicated.  Procedure  A unilateral venous duplex examination was completed using real-time   imaging with spectral and color  flow Doppler. the Internal Jugular, Subclavian, Axillary, Brachial,   Basilic and Cephalic veins were  evaluated of the  right  arm.  [id number] .  Right Shoulder  The internal jugular vein walls compress incompletely. Internal jugular   vein flow pulsatility and  phasicity is decrease. The subclavian vein walls are fully compressible,   where applicable.  Subclavian vein venous flow is spontaneous, phasic and non-pulsatile. The   axillary vein walls  compress completely. Axillary vein flow is spontaneous, phasic and   non-pulsatile.  Right Arm  The walls of the paired brachial veins are completely compressible.   Brachial vein flow is  spontaneous, phasic and non-pulsatile. The radial vein walls compress   completely. The ulnar vein  walls compress completely.  Right Superficial  The cephalic vein walls compress completely. Basilic vein flow is   spontaneous, phasic and non-  pulsatile.  ___________________________________________________________________________  ___  Electronically signed by MD Alverna JONELLE Sieving, MD, [phone]   on   04-07-2024 11   14 AM     CULTURES:  No results found for this visit on 04/06/24 (from the past week). PATHOLOGY: NA  Surgeries/Procedures:     Other procedures performed: None  Consults:   IP CONSULT TO NEPHROLOGY IP CONSULT TO HOSPITALIST  Follow-up Appointments:     Future Appointments  Date Time Provider Department Center  04/10/2024  2:40 PM Helene Fare, MD [facility] PC FAM [facility] [address]   05/17/2024  9:20 AM Sharri Fortune, MD [facility] NEU WST [facility] [place]  05/17/2024 10:40 AM Helene Fare, MD [facility] PC FAM [facility] [address]  06/19/2024 10:00 AM Eva Manus Gum, PA-C [facility] PUL  [facility] [place]  09/18/2024  2:30 PM [facility] HEM ONC HP PERIPHERAL LAB [facility] HEMONC [facility] [place]  09/18/2024  3:00 PM Vallathucherry JAYSON Clamp, MD [facility] HEMONC [facility] [place]  11/20/2024 10:30 AM Erna Creighton, MD [facility] CAR PRE [facility]       Predictive Model Details       21% (Medium) Factor Value   Risk of Unplanned Readmission Model Number of active inpatient medication orders 33    Number of ED visits in last six months 3    Number of hospitalizations in last year 2    ECG/EKG order present in last 6 months    Charlson Comorbidity Index 6    Imaging order present in last 6 months    Latest hemoglobin low (10.1 g/dL)    Phosphorous result present    Diagnosis of deficiency anemia present    Age 12    Active anticoagulant inpatient medication order present    Latest creatinine high (5.05 mg/dL)    Diagnosis of renal failure present    Future appointment scheduled      This document was created using the aid of voice recognition Dragon dication software. Please excuse any sound alike substitutions, typographic, or transcription errors. Efforts have been made to correct these dictation errors; however some may persist, and this does not reflect the standard of medical care. If there are any questions please do not hesitate to contact me for clarification.   Electronically signed by: Lauraine Olivia Critchley, PA-C 04/08/2024 1:31 PM Time spent on discharge: 60 minutes.

## 2024-04-11 ENCOUNTER — Encounter: Payer: Self-pay | Admitting: Hematology & Oncology

## 2024-04-13 ENCOUNTER — Encounter (HOSPITAL_COMMUNITY): Payer: Self-pay

## 2024-04-13 ENCOUNTER — Emergency Department (HOSPITAL_COMMUNITY)
Admission: EM | Admit: 2024-04-13 | Discharge: 2024-04-14 | Disposition: A | Discharge status: Home / Self Care | Attending: Emergency Medicine | Admitting: Emergency Medicine

## 2024-04-13 ENCOUNTER — Emergency Department (HOSPITAL_COMMUNITY)

## 2024-04-13 ENCOUNTER — Ambulatory Visit (HOSPITAL_COMMUNITY): Admission: RE | Admit: 2024-04-13 | Source: Home / Self Care | Admitting: Vascular Surgery

## 2024-04-13 ENCOUNTER — Other Ambulatory Visit: Payer: Self-pay

## 2024-04-13 ENCOUNTER — Encounter (HOSPITAL_COMMUNITY): Admission: RE | Payer: Self-pay | Source: Home / Self Care

## 2024-04-13 DIAGNOSIS — M7989 Other specified soft tissue disorders: Secondary | ICD-10-CM

## 2024-04-13 DIAGNOSIS — Z79899 Other long term (current) drug therapy: Secondary | ICD-10-CM | POA: Diagnosis not present

## 2024-04-13 DIAGNOSIS — I5030 Unspecified diastolic (congestive) heart failure: Secondary | ICD-10-CM | POA: Diagnosis not present

## 2024-04-13 DIAGNOSIS — I132 Hypertensive heart and chronic kidney disease with heart failure and with stage 5 chronic kidney disease, or end stage renal disease: Secondary | ICD-10-CM | POA: Insufficient documentation

## 2024-04-13 DIAGNOSIS — Z87891 Personal history of nicotine dependence: Secondary | ICD-10-CM | POA: Diagnosis not present

## 2024-04-13 DIAGNOSIS — Z7901 Long term (current) use of anticoagulants: Secondary | ICD-10-CM | POA: Diagnosis not present

## 2024-04-13 DIAGNOSIS — N186 End stage renal disease: Secondary | ICD-10-CM | POA: Diagnosis not present

## 2024-04-13 DIAGNOSIS — J449 Chronic obstructive pulmonary disease, unspecified: Secondary | ICD-10-CM | POA: Insufficient documentation

## 2024-04-13 DIAGNOSIS — R2231 Localized swelling, mass and lump, right upper limb: Secondary | ICD-10-CM | POA: Insufficient documentation

## 2024-04-13 DIAGNOSIS — Z992 Dependence on renal dialysis: Secondary | ICD-10-CM | POA: Diagnosis not present

## 2024-04-13 LAB — CBC WITH DIFFERENTIAL/PLATELET
Abs Immature Granulocytes: 0.01 K/uL (ref 0.00–0.07)
Basophils Absolute: 0 K/uL (ref 0.0–0.1)
Basophils Relative: 1 %
Eosinophils Absolute: 0.1 K/uL (ref 0.0–0.5)
Eosinophils Relative: 1 %
HCT: 30.8 % — ABNORMAL LOW (ref 39.0–52.0)
Hemoglobin: 9.8 g/dL — ABNORMAL LOW (ref 13.0–17.0)
Immature Granulocytes: 0 %
Lymphocytes Relative: 16 %
Lymphs Abs: 0.6 K/uL — ABNORMAL LOW (ref 0.7–4.0)
MCH: 27.5 pg (ref 26.0–34.0)
MCHC: 31.8 g/dL (ref 30.0–36.0)
MCV: 86.5 fL (ref 80.0–100.0)
Monocytes Absolute: 0.3 K/uL (ref 0.1–1.0)
Monocytes Relative: 8 %
Neutro Abs: 3 K/uL (ref 1.7–7.7)
Neutrophils Relative %: 74 %
Platelets: 112 K/uL — ABNORMAL LOW (ref 150–400)
RBC: 3.56 MIL/uL — ABNORMAL LOW (ref 4.22–5.81)
RDW: 17 % — ABNORMAL HIGH (ref 11.5–15.5)
WBC: 4 K/uL (ref 4.0–10.5)
nRBC: 0 % (ref 0.0–0.2)

## 2024-04-13 LAB — BASIC METABOLIC PANEL WITH GFR
Anion gap: 8 (ref 5–15)
BUN: 23 mg/dL — ABNORMAL HIGH (ref 6–20)
CO2: 29 mmol/L (ref 22–32)
Calcium: 9.5 mg/dL (ref 8.9–10.3)
Chloride: 97 mmol/L — ABNORMAL LOW (ref 98–111)
Creatinine, Ser: 5.86 mg/dL — ABNORMAL HIGH (ref 0.61–1.24)
GFR, Estimated: 11 mL/min — ABNORMAL LOW (ref >60)
Glucose, Bld: 93 mg/dL (ref 70–99)
Potassium: 4.7 mmol/L (ref 3.5–5.1)
Sodium: 134 mmol/L — ABNORMAL LOW (ref 135–145)

## 2024-04-13 LAB — CBG MONITORING, ED: Glucose-Capillary: 91 mg/dL (ref 70–99)

## 2024-04-13 SURGERY — TUNNELLED CATHETER EXCHANGE
Anesthesia: LOCAL

## 2024-04-13 NOTE — Progress Notes (Addendum)
 Orthopedic Tech Progress Note Patient Details:  Jake Williams 1966/03/16 [id number]  Ortho Devices Type of Ortho Device: Ace wrap, Shoulder immobilizer Ortho Device/Splint Location: RUE Ortho Device/Splint Interventions: Ordered, Application   Post Interventions Patient Tolerated: Well Instructions Provided: Care of device   Jake Williams [name] 04/13/2024, 8:12 PM

## 2024-04-13 NOTE — ED Provider Notes (Signed)
 [place] EMERGENCY DEPARTMENT AT [facility] Provider Note   CSN: [id number] Arrival date & time: 04/13/24  1157     Patient presents with: Hand Problem   Jake Williams is a 58 y.o. 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  Attempted to call the members however have not gotten a hold of anybody.  He denies any trauma or fall to the area.  Was seen at dialysis today was instructed to come into the emergency department for evaluation.  He denies any pain to the area.  Arm is wrapped in Ace bandage.  Denies any chest pain or shortness of breath.  Compliant with medication.   HPI     Prior to Admission medications   Medication Sig Start Date End Date Taking? Authorizing Provider  amLODipine  (NORVASC ) 10 MG tablet Take 1 tablet (10 mg total) by mouth daily. 11/16/19   Singh, Prashant K, MD  carvedilol  (COREG ) 12.5 MG tablet Take 1 tablet (12.5 mg total) by mouth 2 (two) times daily with a meal. 11/16/19   Dennise Lavada POUR, MD  furosemide  (LASIX ) 20 MG tablet Take 1 tablet (20 mg total) by mouth daily. 04/10/14 04/10/15  Donah Laymon PARAS, MD  hydrALAZINE  (APRESOLINE ) 50 MG tablet Take 1 tablet (50 mg total) by mouth every 8 (eight) hours. 11/16/19   Singh, Prashant K, MD  ipratropium-albuterol  (DUONEB) 0.5-2.5 (3) MG/3ML SOLN Use twice a day scheduled and every 4 hours as needed for shortness of breath and wheezing 11/16/19   Singh, Prashant K, MD  isosorbide  mononitrate (IMDUR ) 60 MG 24 hr tablet Take 1 tablet (60 mg total) by mouth daily. 11/16/19   Singh,  Prashant K, MD  lisinopril  (ZESTRIL ) 40 MG tablet Take 1 tablet (40 mg total) by mouth daily. 11/16/19   Singh, Prashant K, MD    Allergies: Patient has no known allergies.    Review of Systems  Constitutional:  Negative for fever.  Respiratory:  Negative for shortness of breath.   Cardiovascular:  Negative for chest pain.  Musculoskeletal:        Reports right arm swelling    Updated Vital Signs BP (!) 144/89 (BP Location: Left Arm)   Pulse 70   Temp 97.6 F (36.4 C) (Oral)   Resp 17   SpO2 98%   Physical Exam Vitals and nursing note reviewed.  Constitutional:      General: He is not in acute distress.    Appearance: He is not ill-appearing or toxic-appearing.     Comments: Eating crackers in no acute distress.  Eyes:     General: No scleral icterus. Cardiovascular:     Pulses:          Radial pulses are 2+ on the right side and 2+ on the left side.  Pulmonary:     Effort: Pulmonary effort is normal. No respiratory distress.  Musculoskeletal:     Comments: Significant swelling into the right upper extremity from fingertips into axilla.  Soft, compartments are soft.  Can wiggle and move his arm and fingers.  Cap refill between  2 and 3 seconds.  Palpable radial pulse that is strong.  Sensation reportedly intact.  Coloration and temperature appear and feel symmetric bilaterally.  No crepitus.  No induration or fluctuance.  No increase in warmth.  No overlying erythema.  Skin:    General: Skin is warm and dry.  Neurological:     Mental Status: He is alert.     (all labs ordered are listed, but only abnormal results are displayed) Labs Reviewed  CBC WITH DIFFERENTIAL/PLATELET - Abnormal; Notable for the following components:      Result Value   RBC 3.56 (*)    Hemoglobin 9.8 (*)    HCT 30.8 (*)    RDW 17.0 (*)    Platelets 112 (*)    Lymphs Abs 0.6 (*)    All other components within normal limits  BASIC METABOLIC PANEL WITH GFR - Abnormal; Notable for the following  components:   Sodium 134 (*)    Chloride 97 (*)    BUN 23 (*)    Creatinine, Ser 5.86 (*)    GFR, Estimated 11 (*)    All other components within normal limits  CBG MONITORING, ED    EKG: None  Radiology: UE Venous Duplex ([facility] and [facility] ONLY) Result Date: 04/13/2024 UPPER VENOUS STUDY  Patient Name:  Jake Williams  Date of Exam:   04/13/2024 Medical Rec #: [id number]         Accession #:    [id number] Date of Birth: [birth year]         Patient Gender: M Patient Age:   20 years Exam Location:  [facility] Procedure:      VAS US  UPPER EXTREMITY VENOUS DUPLEX Referring Phys: DUWAINE FUSI --------------------------------------------------------------------------------  Indications: Swelling Limitations: Perm cath and bandage in right shoulder. Performing Technologist: Elmarie Lindau, RVT  Examination Guidelines: A complete evaluation includes B-mode imaging, spectral Doppler, color Doppler, and power Doppler as needed of all accessible portions of each vessel. Bilateral testing is considered an integral part of a complete examination. Limited examinations for reoccurring indications may be performed as noted.  Right Findings: +----------+------------+---------+-----------+----------+--------------------+ RIGHT     CompressiblePhasicitySpontaneousProperties      Summary        +----------+------------+---------+-----------+----------+--------------------+ IJV           Full                                                       +----------+------------+---------+-----------+----------+--------------------+ Subclavian    Full                                    not seen in its                                                              entirety       +----------+------------+---------+-----------+----------+--------------------+ Axillary      Full                                                        +----------+------------+---------+-----------+----------+--------------------+  Brachial      Full                                                       +----------+------------+---------+-----------+----------+--------------------+ Radial        Full                                                       +----------+------------+---------+-----------+----------+--------------------+ Ulnar         Full                                                       +----------+------------+---------+-----------+----------+--------------------+ Cephalic                                               Not visualized    +----------+------------+---------+-----------+----------+--------------------+ Basilic       Full                                                       +----------+------------+---------+-----------+----------+--------------------+  Left Findings: +----------+------------+---------+-----------+----------+-------+ LEFT      CompressiblePhasicitySpontaneousPropertiesSummary +----------+------------+---------+-----------+----------+-------+ Subclavian    Full                                          +----------+------------+---------+-----------+----------+-------+  Summary:  Right: No evidence of deep vein thrombosis in the upper extremity.  Left: No evidence of thrombosis in the subclavian.  *See table(s) above for measurements and observations.     Preliminary    DG Hand 2 View Right Result Date: 04/13/2024 CLINICAL DATA:  upper extremity swelling EXAM: RIGHT HAND - 2 VIEW COMPARISON:  None Available. FINDINGS: No acute fracture or dislocation. There is no evidence of arthropathy or other focal bone abnormality. Fairly severe soft tissue swelling about the dorsum of the hand. No radiopaque foreign body. Peripheral vascular atherosclerosis. IMPRESSION: 1. Fairly severe soft tissue swelling about the dorsum of the hand. No radiopaque foreign body. 2. No acute  fracture or dislocation. Electronically Signed   By: Rogelia Myers M.D.   On: 04/13/2024 13:07   Procedures   Medications Ordered in the ED - No data to display  Clinical Course as of 04/13/24 1935  Fri Apr 13, 2024  1743 Spoke with Dr. Emi with vascular surgery. He does not feel any additional work up needs to be done.  He said ultimately he needs to have the catheter removed however cannot have this done until he has permanent dialysis access, which she does not.  Recommends outpatient follow-up.  In the meantime, recommends wrapping up with Ace bandage and elevation with compression.  Can follow-up with their service  or [facility] for follow-up. [RR]  1856 Attempted to Call [facility]. Phone number not in service. [RR]  1857 Attempted to call sister, phone is off. HIPAA compliant voicemail left.  [RR]  2 Spoke with sister on the phone.  She reports that they did appointment vascular today however they have the timing wrong but he has an appointment scheduled on Thursday, 7-24.  She was unaware that he was sent to the emergency room from dialysis.  We discussed the plan with vascular surgery, discussed aced bandage wrapping and elevation.  We discussed return precautions and red flag symptoms which she verbalized understanding to.  Patient will be discharged with [facility]. [RR]    Clinical Course User Index [RR] Bernis Ernst, PA-C   Medical Decision Making Amount and/or Complexity of Data Reviewed Labs: ordered.   58 y.o. male presents to the ER for evaluation of right arm swelling. Differential diagnosis includes but is not limited to cellulitis, thrombus, lymphedema, thoracic outlet. Vital signs mildly elevated blood pressure otherwise unremarkable. Physical exam as noted above.   On previous chart evaluation, the patient was seen an evaluated at [facility] from 7/11-7/13 for this issue. He had a CT venous of the chest for concern for SVC thrombus as etiology of swelling. Per  note The CT showed a 1.4 cm stenotic segment of R brachiocephalic vein around the catheter just below the confluence of the R subclavian vein and R internal jugular vein. VascSurg noted this occurs with long term cath use and no need for intervention from their standpoint given cath is working. They rec'd gentle compression w ace wrap and elevation.  I independently reviewed and interpreted the patient's labs.  CBC shows anemia at chronic state and thrombocytopenia at chronic state.  No leukocytosis.  BMP shows a sodium 134, chloride 97, BUN of 23 with a creatinine of 5.86.  Potassium within normal limits.  US  DVT UE Summary:  Right: No evidence of deep vein thrombosis in the upper extremity.  Left: No evidence of thrombosis in the subclavian. Per radiologist's interpretation.    Hand XR 1. Fairly severe soft tissue swelling about the dorsum of the hand. No radiopaque foreign body. 2. No acute fracture or dislocation. Per radiologist's interpretation.    Patient is not complaining of any chest pain or shortness of breath.  His potassium is within normal limits.  Does not appear volume overloaded, other than arm swelling. Do not think he needs emergent dialysis here.  I spoke with on-call vascular surgery with Dr. Emi, please see ED course.   Nursing to wrap arm.   Given reassuring workup as well as patient neurovasc intact distally and consultation with vascular, he is stable for discharge home with outpatient follow-up.  I was able to get a hold of the sister.  Plan as well as return precautions were discussed with her.  She reports that the patient will not keep his arm elevated and is asking for a arm sling.  I discussed with the patient sister that this is likely not as high as it needs to be however Ms. Dasie reports that he is always holding it down.  This may be better than nothing.  Stressed the importance of attending the appointment on Thursday.  I discussed the lab and imaging  results with her.  We discussed strict return precautions were approximate which she verbalized understanding to.  Patient stable for discharge.  I discussed this case with my attending physician who cosigned this note including  patient's presenting symptoms, physical exam, and planned diagnostics and interventions. Attending physician stated agreement with plan or made changes to plan which were implemented.   Attending physician assessed patient at bedside.  Portions of this report may have been transcribed using voice recognition software. Every effort was made to ensure accuracy; however, inadvertent computerized transcription errors may be present.    Final diagnoses:  Swelling of right upper extremity    ED Discharge Orders     None          Bernis Ernst, PA-C 04/13/24 1939    Emil Share, DO 04/13/24 1940

## 2024-04-13 NOTE — ED Provider Triage Note (Signed)
 Emergency Medicine Provider Triage Evaluation Note  Zayin Valadez , a 58 y.o. male  was evaluated in triage.  Pt complains of hand swelling.  Patient was sent from dialysis for right hand swelling.  States he thinks his arm is warm to.  He does not know how long it has been.  He states he is not having any pain or trouble moving his arm.  He is very poor historian.  Review of Systems  Positive: Arm swelling, hand swelling Negative: Chest pain, sob   Physical Exam  BP (!) 147/85 (BP Location: Left Arm)   Pulse 66   Temp 97.9 F (36.6 C) (Oral)   Resp 18   SpO2 100%  Gen:   Awake, no distress   Resp:  Normal effort  MSK:   Moves extremities without difficulty. Right hand is very swollen and boggy with some swelling of upper arm as well. No difficulty moving in, no erythema, no pain    Medical Decision Making  Medically screening exam initiated at 12:14 PM.  Appropriate orders placed.  Cordella Lemmings was informed that the remainder of the evaluation will be completed by another provider, this initial triage assessment does not replace that evaluation, and the importance of remaining in the ED until their evaluation is complete.  Duwaine Fusi, DO    Deyci Gesell L, DO 04/13/24 1215

## 2024-04-13 NOTE — ED Triage Notes (Signed)
 [facility] reports pt coming from dialysis today for right hand swelling that started one or two days ago. Pt did not receive dialysis today, they sent him here to be evaluated for his hand.

## 2024-04-13 NOTE — ED Notes (Signed)
 [facility] called 7 ahead

## 2024-04-13 NOTE — Discharge Instructions (Addendum)
 You were seen in the ER today for evaluation of your arm swelling. This is likely from your catheter. The vascular surgery team would like for you to keep the arm wrapped and elevated to help with the swelling. Please make sure that you attend your scheduled appointment next week. If the arm has any coloration changes, increasing in size, pain, temperature changes, or any other new/worsening symptom or concern, please return to your nearest emergency department for reevaluation.

## 2024-04-13 NOTE — Progress Notes (Signed)
 RUE venous completed.   Jake Williams 04/13/2024, 1:36 PM

## 2024-04-18 NOTE — Procedures (Signed)
 Dialysis Progress Note   HEMODIALYSIS INTRA-PROCEDURE NOTE  PatientGregory Williams was seen and examined on hemodialysis.  CHIEF COMPLAINT:  End Stage Kidney Disease  INTERVAL HISTORY:  No problems with dialysis. MS is clear. He offers no complaints.   Current Medications[1]   PHYSICAL EXAM: Vitals: Temp:  [97.7 F (36.5 C)-98.4 F (36.9 C)] 98.2 F (36.8 C) Heart Rate:  [59-72] 72 Resp:  [16-18] 17 BP: (141-173)/(91-102) 163/100  : Admission Weight: 90.7 kg (200 lb) Last documented Weight: 84.5 kg (186 lb 4.6 oz) Weight change in the last 24 hours: Unable to Calculate   ASSESSMENT: General appearance: alert and no distress Lungs: clear to auscultation bilaterally Heart: regular rate and rhythm, S1, S2 normal, no murmur, click, rub or gallop Abdomen: soft, non-tender; bowel sounds normal; no masses,  no organomegaly Extremities: extremities normal, atraumatic, no cyanosis or edema Skin: Skin color, texture, turgor normal. No rashes or lesions  ACCESS:  Permcath Dialysis Access Exam: no drainage   LAB DATA: Data Review:   Results from last 7 days  Lab Units 04/18/24 0723 04/17/24 2053 04/17/24 0752 04/16/24 1310  SODIUM mmol/L 132* 131* 133*  --   POTASSIUM mmol/L 5.5* 5.2* 4.8* 6.6*  CHLORIDE mmol/L 98 98 99  --   CO2 mmol/L 29 29 29   --   BUN mg/dL 25 23 20   --   CREATININE mg/dL 3.37* 3.87* 4.49*  --   EGFR mL/min/1.9m2 9* 10* 11*  --   GLUCOSE mg/dL 81 87 82  --   CALCIUM  mg/dL 8.7 8.2* 8.4*  --   PHOSPHORUS mg/dL  --   --   --  4.4       Invalid input(s): ALKPHOS, LABALBU Results from last 7 days  Lab Units 04/16/24 1310  MAGNESIUM mg/dL 2.8*   Results from last 7 days  Lab Units 04/18/24 0953 04/17/24 0122 04/16/24 1023  WHITE BLOOD CELL COUNT 10*3/uL 3.21* 3.52* 4.11*  HEMOGLOBIN g/dL 9.5* 9.2* 9.5*  HEMATOCRIT % 27.7* 26.6* 28.0*  PLATELET COUNT 10*3/uL 112* 108* 107*    Imaging: I reviewed imaging study  No   PLAN: Ultrafiltration Goal:  2 L Patient Active Problem List   Diagnosis Date Noted  . Hyperkalemia 04/16/2024  . Edema of right upper arm 04/08/2024  . Bilateral leg edema 04/06/2024  . DVT of upper extremity (deep vein thrombosis)    (CMD) 03/10/2024  . General weakness 01/15/2024  . Impaired mobility and ADLs 01/14/2024  . Generalized weakness 01/13/2024  . Platelets decreased 06/14/2023  . PAD (peripheral artery disease) 06/13/2023  . Hypercalcemia 05/11/2023  . Loculated pleural effusion 08/25/2022  . Patent foramen ovale with right to left shunt (CMD) 07/13/2022  . Abnormality of gait 10/15/2021  . Injury to ligament of cervical spine 10/13/2021  . Intracranial hemorrhage (HCC) 04/01/2021  . 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 ESRD on dialysis    (CMD) 07/05/2020  . Rectal bleeding 07/05/2020  . Normocytic anemia 07/05/2020  . Chronic respiratory failure with hypoxia (HCC) 04/11/2020  . Lymphedema 04/11/2020  . Aneurysm of infrarenal abdominal aorta (HCC) 01/13/2020  . Endoleak post endovascular aneurysm repair 01/12/2020  . 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 Dependence on nocturnal oxygen therapy 05/09/2019  . COPD (chronic obstructive pulmonary disease)    (CMD) 04/06/2019  . Chronic heart failure with preserved ejection fraction (HFpEF)    (CMD) 06/09/2018  . History of hepatitis B 05/01/2018  . Tobacco use disorder  03/03/2013  . OSA (obstructive sleep apnea) 10/11/2011  . Erectile dysfunction 08/24/2011  . Hypertension 07/27/2011  . Cerebral aneurysm 07/27/2011    ESRD.  HD today on schedule Hyperkalemia - restrict in diet HTN - blood pressures suboptimally controlled. Norvasc  added. Anemia - Hb stable. Dosed with EPO last night.          [1] Current Facility-Administered Medications  Medication Dose Route Frequency Provider Last Rate Last Admin  . amantadine (SYMMETREL) capsule 200 mg  200 mg  oral Q7 Days Santosh K Dhungana, MD   200 mg at 04/16/24 2133  . amLODIPine  (NORVASC ) tablet 10 mg  10 mg oral QAM Santosh K Dhungana, MD   10 mg at 04/17/24 9166  . apixaban (ELIQUIS) tablet 5 mg  5 mg oral BID Santosh K Dhungana, MD   5 mg at 04/17/24 2037  . revefenacin (YUPELRI) 175 mcg/3 mL nebulizer solution 175 mcg  175 mcg nebulization RDaily Santosh K Dhungana, MD   175 mcg at 04/18/24 0830   And  . budesonide (PULMICORT) 0.5 mg/2 mL nebulizer solution 0.5 mg  0.5 mg nebulization RBID Santosh K Dhungana, MD   0.5 mg at 04/18/24 0830   And  . formoterol (PERFOROMIST) 20 mcg/2 mL nebulizer solution 20 mcg  20 mcg nebulization RBID Derryl MARLA Course, MD   20 mcg at 04/18/24 0830  . cinacalcet (SENSIPAR) tablet 30 mg  30 mg oral Daily Santosh K Dhungana, MD   30 mg at 04/17/24 0833  . cloNIDine (CATAPRES) tablet 0.1 mg  0.1 mg oral TID Santosh K Dhungana, MD   0.1 mg at 04/18/24 1317  . dextrose (D50W) 50 % injection 12.5 g  12.5 g intravenous PRN Santosh K Dhungana, MD      . dextrose (GLUTOSE) 40 % oral gel 15 g  15 g oral PRN Derryl MARLA Course, MD      . docusate sodium (COLACE) capsule 100 mg  100 mg oral Daily PRN Santosh K Dhungana, MD      . folic acid (FOLVITE) tablet 400 mcg  400 mcg oral Daily Santosh K Dhungana, MD   400 mcg at 04/17/24 9166  . gentamicin 0.3 mg/mL in sodium citrate 4% catheter dwell 3 mL  3 mL intra-catheter GIve in dialysis Jeanne Marie Zekan, MD   3 mL at 04/18/24 1233  . hydrALAZINE  (APRESOLINE ) tablet 100 mg  100 mg oral TID Santosh K Dhungana, MD   100 mg at 04/18/24 1317  . ipratropium-albuteroL  (DUO-NEB) 0.5-2.5 mg/3 mL nebulizer solution 3 mL  3 mL nebulization Q6H PRN Derryl MARLA Course, MD      . labetaloL (NORMODYNE) tablet 300 mg  300 mg oral BID Santosh K Dhungana, MD   300 mg at 04/17/24 2037  . lisinopriL  (PRINIVIL ) tablet 40 mg  40 mg oral Daily Santosh K Dhungana, MD   40 mg at 04/17/24 0833  . polyvinyl alcohol (LIQUIFILM TEARS) 1.4 %  ophthalmic solution 1 drop  1 drop Both Eyes PRN Santosh K Dhungana, MD      . sodium zirconium cyclosilicate  (LOKELMA ) packet 10 g  10 g oral Once Leita Phenix, MD

## 2024-04-18 NOTE — Progress Notes (Signed)
 [facility] -  Daily Progress note                                                Hospitalist Daily Progress Note     Date: 04/18/2024        Room: 736/01 Hospital Day: 2 Attending Physician: Derryl MARLA Course, MD  Subjective / Interval History / ROS   Patient is DIALYSIS, today, reports no acute overnight events  Patient is awaiting PT evaluation, given noted weakness, not able to safely ambulate with staff  Discharge plans once PT recommendations are in.     Assessment and Plan   Assessment & Plan Hyperkalemia - Patient with known CKD, on dialysis Monday Wednesday Friday - presenting with altered mentation, per family - Per sister, patient while trying to use the bathroom had  messed up on the bathroom floor for which she felt concerned and called EMS - At presentation, patient noted to have hyperkalemia at 6.6, with no acute EKG changes - Patient was treated for acute hyperkalemia with insulin -dextrose, calcium  gluconate,, continuous nebulization - Nephrology was consulted from ED, plan changes plan for dialysis - Continue to monitor potassium levels closely  7/22- had HD yesterday K at - 4.8  Aneurysm of infrarenal abdominal aorta (HCC) -Known, previously treated, with Repair, revision of endoleak 221  Hypertension -Amlodipine  10, clonidine 0.1, hydralazine  100 3 times daily, labetalol 300 twice daily, lisinopril  40 daily, - resume home medication and monitor  Chronic heart failure with preserved ejection fraction (HFpEF)    (CMD) - Patient echo from 10/23-noted EF at 60 to 65%, aortic valve sclerosis, mild MR, mild TR, patent foramen ovale - Patient currently on dialysis, for wound treatment - Not short of breath - Continue to monitor  Cerebral aneurysm -Per history, patient had hyperestrogenism, extensive IVH, stent placement 2022 - Large subdural hematoma 2023, purulent drainage - No acute clinical concern from surgical site - Has  baseline mild cognitive decline  OSA (obstructive sleep apnea) -, Patient uses nightly oxygen at 2 L - OSA, sleep apnea, will add NIV protocol  Chronic respiratory failure with hypoxia (HCC) - as above uses nightly oxygen - uses inhalers at home - added  ESRD on dialysis    (CMD) - Known ESRD, follows [facility] for Monday Wednesday Friday dialysis - will request unattached nephro team for HD  PAD (peripheral artery disease) - known and follows vascular   DVT of upper extremity (deep vein thrombosis)    (CMD) DVT, along with multiple vascular issues from before - Patient on Eliquis  Weakness - Patient presenting with concerns of missing the floor, and not being is baseline self - Patient completed dialysis on 7/21, with improvement of his potassium - Awaiting PT evaluation, for safe discharge disposition      HPI   Jake Williams is a 58 y.o. male with a PMH significant for  HTN, HFpEF, PFO, ESRD on HD MWF, COPD w resp failure on 2L O2, BPH, chronic Hepatitis B, Hx ruptured cerebral aneurysm 2010, extensive IVH s/p shunt placement 2022, large SDH s/p bur hole evacuation 2023, cognitive impairment, infrarenal AAA dissection s/p graft repair & revision of endoleak 2021, PE s/p IVC filter 2014, right IJ DVT on Eliquis, chronic pancytopenia, former smoker    presented to [facility] after family called EMS, noting that the patient  was altered, more than his baseline. Patient was brought to the ED, where patient appears to be mildly confused about why he is in the hospital, is oriented to self, but is not oriented to place, denied any acute issues, and did recall that he was supposed to be getting dialysis today Patient is not able to give much history beyond this.   Spoke to patient's sister Barnie with regards to his current ED visit, mentioned that patient was trying to use bathroom, and had messed it all up manage patient has had recent hospitalization, with changes  to his medications, and she was not sure if medication changes were causing this, and called EMS   Per sister, patient is also supposed to follow-up at [facility] with regards to changing his dialysis access because of the right arm swelling, she is working on follow-up appointments, but is now confused as he needs to be in the hospital more often, not able to keep the appointments   At presentation patient, appears comfortable, not able to narrate much history, but does remember he needs dialysis, his right arm appears slightly swollen, but he is not in any discomfort Labs suggestive of CKD, ESRD, with concerns of elevated potassium at 6.6 Nephrology has been consulted from the ED, for need of dialysis  The primary contact is:  Extended Emergency Contact Information Primary Emergency Contact: Allen,Deborah Home Phone: [phone] Work Phone: [phone] Mobile Phone: [phone] Relation: Sister Secondary Emergency Contact: Primus,Tacara Mobile Phone: [phone] Relation: Daughter Preferred language: Unable to Obtain   Code Status: Full Code    DVT ppx: Subcutaneous Heparin   Current Diet:     Dietary Orders  (From admission, onward)               Ordered    Adult Diet- Renal; Dialysis (K/Phos/Na restricted)  Diet effective now       References:    Medical Nutrition Management (MNM) for Registered Dietitian  Question Answer Comment  Diet type: Renal   Renal Restriction: Dialysis (K/Phos/Na restricted)   Medical Nutrition Management By RD Yes, Medical Nutrition Management By RD      04/16/24 1303             Discharge Planning  The current disposition plan is discharge to Other: TBD in 1-2 days. Barriers to discharge: weakness Discharge follow up needed: Nephrology, PCP     Objective   Temp:  [97.9 F (36.6 C)-98.4 F (36.9 C)] 98.4 F (36.9 C) Heart Rate:  [59-64] 64 Resp:  [16-18] 18 BP: (130-160)/(84-99) 156/91   Intake/Output Summary (Last 24 hours)  at 04/18/2024 0934 Last data filed at 04/18/2024 0853 Gross per 24 hour  Intake 340 ml  Output 80 ml  Net 260 ml      Physical Examination:  To dialysis today    Lines:  Patient Lines/Drains/Airways Status     Active Peripherally inserted central catheter / Central venous catheter / Peripheral intravenous line / Drain / Airway / Intraosseous line / Epidural catheter / Arterial line / Line / Nasogastric/Orogastric Tube / Hemodialysis catheter / Umbilical venous catheter / Urethral Catheter / Intrauterine Pressure Catheter / Implantable Port / NHSN Urethral Catheter / NHSN Umbilical Catheter / Intentionally Retained Foreign Objects / AV Fistula / Peripheral Nerve Catheter     Name Placement date Placement time Site Days   Peripheral IV 04/16/24 20 G Left Forearm 04/16/24  1256  Forearm  1   Hemodialysis Cath Double Lumen Tunneled catheter Subclavian --  --  --  96   Hemodialysis Cath Double Lumen Tunneled catheter Subclavian --  --  --  --             Labs and Results  I have reviewed the following labs and studies Lab Results  Component Value Date   WBC 3.52 (L) 04/17/2024   HGB 9.2 (L) 04/17/2024   HCT 26.6 (L) 04/17/2024   MCV 82.3 04/17/2024   PLT 108 (L) 04/17/2024   Lab Results  Component Value Date   GLUCOSE 81 04/18/2024   CALCIUM  8.7 04/18/2024   NA 132 (L) 04/18/2024   K 5.5 (H) 04/18/2024   CO2 29 04/18/2024   CL 98 04/18/2024   BUN 25 04/18/2024   CREATININE 6.62 (H) 04/18/2024   Lab Results  Component Value Date   ALT 4 (L) 04/07/2024   AST 7 (L) 04/07/2024   BILITOT 0.6 04/07/2024   Lab Results  Component Value Date   INR 1.0 03/06/2024   INR 1.08 01/18/2022   INR 1.09 10/29/2021   PROTIME 12.9 03/06/2024   PROTIME 14.0 01/18/2022   PROTIME 14.1 10/29/2021    Micro: No results found for this visit on 04/16/24 (from the past week).      Medications  Scheduled Medications: amantadine, 200 mg, oral, Q7 Days amLODIPine , 10 mg, oral,  QAM apixaban, 5 mg, oral, BID revefenacin, 175 mcg, nebulization, RDaily  And budesonide, 0.5 mg, nebulization, RBID  And formoterol, 20 mcg, nebulization, RBID cinacalcet, 30 mg, oral, Daily cloNIDine, 0.1 mg, oral, TID folic acid, 400 mcg, oral, Daily hydrALAZINE , 100 mg, oral, TID labetaloL, 300 mg, oral, BID lisinopriL , 40 mg, oral, Daily sodium zirconium cyclosilicate , 10 g, oral, Once   Prn Medications: .  dextrose .  dextrose .  docusate sodium .  gentamicin 0.3 mg/mL in sodium citrate 4% .  ipratropium-albuteroL  .  polyvinyl alcohol Continuous Medications: .     Electronically signed byGLENWOOD Derryl MARLA Deneen, MD 04/18/24 9:34 AM  This document was created using the aid of voice recognition Dragon dictation software.  Chances of interpretation errors are likely, despite attempts to correct errors

## 2024-04-19 ENCOUNTER — Encounter (HOSPITAL_COMMUNITY): Admission: RE | Payer: Self-pay | Source: Home / Self Care

## 2024-04-19 ENCOUNTER — Ambulatory Visit (HOSPITAL_COMMUNITY): Admission: RE | Admit: 2024-04-19 | Source: Home / Self Care | Admitting: Vascular Surgery

## 2024-04-19 SURGERY — TUNNELLED CATHETER EXCHANGE
Anesthesia: LOCAL

## 2024-04-19 NOTE — Care Plan (Signed)
  Problem: Knowledge Deficit Goal: Patient/family/caregiver demonstrates understanding of disease process, treatment plan, medications, and discharge instructions Description: Complete learning assessment and assess knowledge base. Outcome: Progressing   Problem: Compromised Skin Integrity Goal: Skin integrity is maintained or improved Description: Assess and monitor skin integrity. Identify patients at risk for skin breakdown on admission and per policy. Collaborate with interdisciplinary team and initiate plans and interventions as needed.    Outcome: Progressing Goal: Fluid and electrolyte balance are achieved/maintained Description: Assess and monitor vital signs (orthostatic vitals if applicable), fluid intake and output, urine color, labs, skin turgor, mucous membranes, jugular venous distention, edema, circumference of edematous extremities and abdominal girth, respiratory status, and mental status.  Monitor for signs and symptoms of hypovolemia (tachycardia, rapid breathing, decreased urine output, postural hypotension, confusion, syncope).  Monitor for signs and symptoms of hypervolemia (strong rapid pulse, shortness of breath, difficulty breathing lying down, crackles heard in lung fields, edema). Collaborate with interdisciplinary team and initiate plan and interventions as ordered. Outcome: Progressing Goal: Nutritional status is improving Description: Monitor and assess patient for malnutrition (ex- brittle hair, bruises, dry skin, pale skin and conjunctiva, muscle wasting, smooth red tongue, and disorientation). Collaborate with interdisciplinary team and initiate plan and interventions as ordered.  Monitor patient's weight and dietary intake as ordered or per policy. Utilize nutrition screening tool and intervene per policy. Determine patient's food preferences and provide high-protein, high-caloric foods as appropriate.  Outcome: Progressing   Problem: Urinary Incontinence Goal:  Perineal skin integrity is maintained or improved Description: Assess genitourinary system, perineal skin, labs (urinalysis), and history of incontinence to include past management, aggravating, and alleviating factors.  Collaborate with interdisciplinary team and initiate plans and interventions as needed. Outcome: Progressing   Problem: Health Behavior: Goal: MCB Ability to state ways to decrease the risk of falls will be met by discharge Description: Ability to state ways to decrease the risk of falls will improve by discharge Outcome: Progressing   Problem: Safety: Goal: Will remain free from falls by discharge Description: Will remain free from falls by discharge Outcome: Progressing   Problem: Activity: Goal: Ability to ambulate will improve Description: Ability to ambulate will improve Outcome: Progressing Goal: Ability to safely and independently change position in bed will improve Description: Ability to safely and independently change position in bed will improve Outcome: Progressing Goal: Range of joint motion will improve Description: Range of joint motion will improve Outcome: Progressing

## 2024-04-19 NOTE — Unmapped External Note (Addendum)
 Acute Physical Therapy Evaluation  Precautions: Other Therapy Precautions: Fall risk, Other (Comment) Comment: SAGE protocol   Medical Diagnosis/Course:  PT Diagnosis/Course Pertinent Medical Course: Patient is a 58 year old male admitted to [facility] 04/16/2024 with family complaints of AMS undergoing medical management for hyperkalemia.  Recent admission 04/11/2024 for RUE DVT.  ASSESSMENT SUMMARY   Patient is a 58 y.o. male admitted to [facility] 04/16/2024 for above noted medical diagnosis/course. Significant PMH includes: HTN, heart failure with preserved ejection fraction, PFO, ESRD on HD Monday, Wednesday, Friday, COPD on 2 L O2 at baseline. Functional level prior to admission: Sister drives. Sister manages medications. Patient is ind with ADL/IADLs per his report. R handed. Patient reports ambulating in home with no AD vs SPC in the home. Patient ambulates with SPC vs walker in community..   A Moderate Complexity Physical Therapy evaluation was completed this date. The patient presents with a decline from functional baseline with deficits noted in problem list below. Please see functional mobility section below for information pertaining to current assistance levels provided during this session in comparison to above noted functional level PTA. Pt deficits present as [employer] AM-PACT "6 Clicks" Basic Mobility score of 12 indicating 66.57% of functional impairment. Pt will benefit from skilled acute PT services to address deficits for improved safety with functional mobility. PT recommends rehabilitation facility at DC for continued functional mobility training to improve independence with all levels of mobility and return toward PLOF.  Per the evaluation and assessment as completed by this therapist, the patient presents with deficits that impair functional mobility and ADLs, requiring a higher level of physical assistance than what is currently available and safe in the home setting. The patient  appears to be most appropriate for multidisciplinary therapy services in the post-acute inpatient setting with a frequency of 5x/week or higher to further improve functional abilities and quality of life via progression towards individualized patient/family-centered goals.   Problem list:  Rehab Potential Rehab Potential: Fair Impairments/Limitations: Transfer deficits, Ambulation Deficits, Balance Deficits, Muscle weakness, Activity Tolerance Deficits, Pain limiting function, Bed mobility deficits, Mobility deficits   PT Recommendation: PT Recommendation: Rehabilitation Facility (In the event DC home is elected recommend [facility] PT/OT, and continuous physical assistance from family) PT Equipment Recommended: Defer to next level of care  Home Living Environment: Type of Home House  Home Layout One level, Ramped entry  Tax inspector - number    Exterior Stairs - Psychologist, sport and exercise - number    Interior Stairs - Administrator, sports / Tub Administrator, Civil Service, Paediatric nurse, Regular toilet height, Bedside commode  [employer] [place], [place], Higher education careers adviser, Advice worker, Cane-single point  Equipment Currently Using    Additional Comments Sister drives. Sister manages medications. Patient is ind with ADL/IADLs per his report. R handed. Patient reports ambulating in home with no AD vs SPC in the home. Patient ambulates with SPC vs walker in community.   Prior Level of Function: Level of Independence Requiring assistance  Lives With [facility]) able to assist at d/c Patient reports that his uncle and aunt are available 24/7  Patient Responsibilities    Requires Assist With     Fall History: Subjective Fall History Fall in the last year?: No Feel unsteady or off balance?: Yes (somewhat)  PERTINENT MEDICAL INFORMATION   PT Treatment Diagnosis:    Reduced Mobility  Past Medical  History: Medical History[1]  Past Surgical  History: Surgical History[2]  Lines and Leads: saline lock   SUBJECTIVE   Communication:  Communication: Patient, Clinical Case Management, Nursing, MD Communication Details: Patient advised in DC recommendations. CM and MD notified of DC recommendations and any needed DME. RN alerted to patients resting position and functional performance during evaluation.    Subjective: Patient is agreeable to therapy session this date.  Patient Goal: No specific goals reported per patient.  Assessed in the Presence of: Wright SPT   Consent for therapy provided by: Nurse  OBJECTIVE   Pain: Pain Assessment: No/denies pain  Cognition: Orientation Level: Not oriented Not Oriented to: Situation  Range of Motion: Overall ROM Assessment: Within Functional Limits ROM Additional Comments: From observation bed mobility, transfers, and functional mobility including self attempted hygiene completion while sitting on commode BUE/BLE appear WFL.  Strength: Strength Comments: BLE grossly 4- to 4/5.  BUE grossly 4- to 4/5.  Coordination:   and Sensation: Sensation Numbness: Denies BUE/BLE N/T  Skin Integrity and Edema:  Skin Integrity & Edema Skin: Intact in visualized areas Edema: No edema  Balance: Sitting - Static: Distant supervision Sitting - Dynamic: Close supervision Standing - Static: Minimal Assistance Standing - Dynamic: Moderate Assistance, Minimal Assistance  Functional Mobility:  Bed Mobility Bed Mobility Additional Comments: Patient transitions from supine to sitting with BUE HHA and mod assist for truncal elevation with patient demonstrating ability to obtain right side-lying position but inability to propel with BUE from side-lying into sitting position despite multiple effortful attempts.  Patient is able to scoot anteriorly to edge of bed with close supervision with BUE pushing from bedside although with increased energy  expenditure and patient reporting fatigue quickly. Transfers Transfers  Additional Comments: Patient completing sit to stand from bedside with mod assist with verbal cues for hand placement to push with RUE from bedside with significant left-sided lean and significantly increased time for obtaining upright stance with tactile cues for hip and knee extension.  Patient completes stand to sit at [facility] placed over commode with min assist and verbal cues for hand placement to control descent.  Sit to stand from bedside commode placed over commode with mod assist with continued cues for hand placement to push with LUE from grab bar.  Patient returned to sitting in recliner with min assist for controlling descent with verbal cues for hand placement to armrest to recliner but patient demonstrating lack of BUE transition and reporting significant fatigue after toileting.  Patient requiring dependent assistance for hygiene thoroughness while in standing at commode secondary to significant loose bowel movement. Mobility Gait Distance (ft): 10 ft (x2 trials) Gait Additional Comments: Patient ambulating 10 feet x 2 trials to/from bathroom with RW and minimal to moderate assistance for balance maintenance.  During first gait trial 10 feet into bathroom patient with significant lateral lean with left-sided RW supports off of ground requiring maximal multimodal cues for placement of all 4 RW contact points to floor.  Mod assist required for balance maintenance during first gait trial.  Min assist required for gait trial return from bathroom to bedside with continued RW use and improved ability to place all 4 RW contact points to floor.  Condition After Therapy:  Up in chair, Nursing notified of condition, All needs within reach, Alarm activated, Fall interventions in place, Family present in room, Lines intact  INTERVENTIONS   Skilled PT Treatment Provided  Patient guided through transfer training with appropriate hand  placement for propulsion from seated surface prior to transition to RW.  Patient provided cues for dynamic balance assistance during toileting tasks and for education for positioning of RW around BLE base of support for steadiness while assisting with brief management/hygiene.   Skill Performed by Clinician: Education on proper use of assistive device/adaptive equipment Education on safe/proper technique Facilitation for proper positioning/movement in preparation for functional tasks Monitoring exercise/activity tolerance Monitoring safe progression of exercise/activity Tactile cues for correction of atypical postures or movements Tactile cues for proper technique Tactile cues for safe execution of functional tasks Verbal cues for proper technique Verbal cues for safe execution of functional tasks Visual cues for proper and safe technique  Education:  Education provided to: Patient Education provided regarding:  Role and POC of PT, Importance of increasing activity, OOB to chair for all meals, Call and wait for assistance, Discharge recs, and Use of assistive device Response to Education: Patient verbalizes understanding and Patient needs continued reinforcement  PERFORMANCE OUTCOME MEASURES   AM-PAC - Basic Mobility:    Flowsheet Row Most Recent Value  AM-PAC 6-Clicks - Basic Mobility  Turning from you back to your side while in a flat bed without using bed rails? A lot  Moving from lying on your back to sitting on the side of a flat bed without using bed rails? A lot  Moving to and from a bed to a chair (including a wheelchair)? A lot  Standing up from a chair using your arms (e.g, wheelchair, or bedside chair)? A lot  To walk in a hospital room? A lot  Climbing 3-5 steps with a railing? A lot  AM-PAC Total Score 12    PT PLAN OF CARE   Rehab Potential:  Rehab Potential: Fair Impairments/Limitations: Transfer deficits, Ambulation Deficits, Balance Deficits, Muscle  weakness, Activity Tolerance Deficits, Pain limiting function, Bed mobility deficits, Mobility deficits  Plan:  Plan Treatment Plan/Goals Established with Patient/Caregiver: Yes Planned Treatment/Interventions: Patient/Caregiver education, Therapeutic activity, Therapeutic exercise, Gait/mobility training PT Frequency: 3x week  PT Goals:  Encounter Problems     Encounter Problems (Active)     Physical Therapy     Pt will be mod ind for all bed mobility to reduce risk of pressure ulcers, improve safety with functional mobility, and reduce caregiver burden prior to discharge.      Start:  04/19/24    Expected End:  05/10/24         Pt will be mod ind for all transfers from various surface levels to improve overall safety with functional mobility and decrease caregiver burden.      Start:  04/19/24    Expected End:  05/10/24         Pt will ambulate 178ft with LRAD and close sup to improve overall safety with functional mobility and to decrease caregiver burden at DC.      Start:  04/19/24    Expected End:  05/10/24         Pt will be able to perform 10-15 reps of BLE exercise without rest breaks, for maintenance of strength and endurance while hospitalized, to improve overall activity tolerance and safety with functional mobility upon discharge.      Start:  04/19/24    Expected End:  05/10/24             TREATMENT TIME   Time In: 1325 Time Out: 1403 Time in Timed codes: 23 Total Treatment Time: 38 PT Eval Mod Complexity minutes: 15   Treatment/Procedures Time Entry Therapeutic Activity minutes: 23   Charges  04/19/2024   Code Description Service Provider Modifiers Quantity  [id number] Hc Pt Therapeut Actvity Direct Pt Contact Each [address] PT GP 2  [id number] Hc Pt Physical Therapy Evaluation Mod Complex 30 Mins Roberts JONETTA Molt, PT GP 1        Time of Service Note Type Status  1403 Therapy Evaluation Signed         PT Evaluation  Complexity History of Comorbidities/Personal Care Impacting Plan of Care: 3-4 personal factors and/or comorbidities Examination of Body Systems: 4 or more elements Presentation of Characteristics: Evolving with changing characteristics PT Eval Complexity Score: 12 PT Eval Complexity Score: Moderate Complexity        [1] Past Medical History: Diagnosis Date  . (HFpEF) heart failure with preserved ejection fraction    (CMD)   . Acute epididymitis 05/19/2017  . 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 Cerebral aneurysm (CMD)   . Chest pain 07/27/2011  . Cocaine use disorder, mild, abuse (CMD) 03/03/2013  . COPD (chronic obstructive pulmonary disease)    (CMD)   . COVID-19 virus infection 09/13/2019  . End stage renal disease on dialysis    (CMD)   . Hepatitis B infection without delta agent without hepatic coma 11/12/2016  . History of intracerebral hemorrhage without residual deficit 12/12/2022  . Hypertension   . Intracerebral hemorrhage    (CMD) 03/03/2013  . Mass of soft tissue of left lower extremity 04/08/2022   Last Assessment & Plan:   Formatting of this note might be different from the original.  Likely a cyst, with lipoma possible as well. Ultrasound ordered for characterization prior to referral to specialist for removal.    . Moderate episode of recurrent major depressive disorder (HCC) 04/01/2019  . Noncompliance with CPAP treatment 05/09/2019  . Noncompliance with treatment 11/09/2016  . Pulmonary embolism with infarction    (CMD) 03/03/2013  . Sepsis    (CMD) 05/19/2017  . Sleep apnea   . Smoker   . UTI (urinary tract infection) 05/19/2017  [2] Past Surgical History: Procedure Laterality Date  . AORTIC ENDOGRAFT N/A 12/31/2019   AORTIC ANEURYSM AND TYPE B DISECTION;  Donnice Belvie Fear, MD  . BRAIN SURGERY    . BURR HOLE FOR SUBDURAL HEMATOMA Left 10/30/2021   Rockey Ozell Chaco, MD  . COLONOSCOPY N/A  07/08/2020   Teena DELENA Mcgregor, MD  . DIALYSIS CATHETER INSERTION    . ESOPHAGOGASTRODUODENOSCOPY N/A 07/08/2020   Teena DELENA Mcgregor, MD  . GASTROSTOMY TUBE PLACEMENT  07/26/2021   Bonnie Gaither Cleotilde DOUGLAS, MD  . INGUINAL HERNIA REPAIR Right 07/26/2021   open;  Bonnie Gaither Cleotilde DOUGLAS, MD  . TRACHEOSTOMY N/A 07/26/2021   Bonnie Gaither Cleotilde DOUGLAS, MD  . VENTRICULO-PERITONEAL SHUNT PLACEMENT / LAPAROSCOPIC INSERTION PERITONEAL CATHETER N/A 04/13/2021   Rockey Ozell Chaco, MD

## 2024-04-19 NOTE — Progress Notes (Signed)
 [facility] -  Daily Progress note                                                Hospitalist Daily Progress Note     Date: 04/19/2024        Room: 736/01 Hospital Day: 3 Attending Physician: Derryl MARLA Course, MD  Subjective / Interval History / ROS   Patient was seen and examined at bedside, presence of assistant Patient was seen, examined with PT, OT, and advised for SNF, versus home with PT OT  CM has set up home health PT OT for the patient  Patient is on Monday Wednesday Friday  dialysis according to the sister, as he is in the hospital, dialysis, transport has not been set up for dialysis on 7/25  Will keep him overnight, request early morning dialysis on 7/25 and Plan discharge     Assessment and Plan   Assessment & Plan Hyperkalemia - Patient with known CKD, on dialysis Monday Wednesday Friday - presenting with altered mentation, per family - Per sister, patient while trying to use the bathroom had  messed up on the bathroom floor for which she felt concerned and called EMS - At presentation, patient noted to have hyperkalemia at 6.6, with no acute EKG changes - Patient was treated for acute hyperkalemia with insulin -dextrose, calcium  gluconate,, continuous nebulization - Nephrology was consulted from ED, plan changes plan for dialysis - Continue to monitor potassium levels closely  7/22- had HD yesterday K at - 4.8  7/24- 4.4  Aneurysm of infrarenal abdominal aorta (HCC) -Known, previously treated, with Repair, revision of endoleak 221  Hypertension -Amlodipine  10, clonidine 0.1, hydralazine  100 3 times daily, labetalol 300 twice daily, lisinopril  40 daily, - resume home medication and monitor  -7/24- appreciate nephrology recommendations for BP medicatin adjustment  Chronic heart failure with preserved ejection fraction (HFpEF)    (CMD) - Patient echo from 10/23-noted EF at 60 to 65%, aortic valve sclerosis, mild MR, mild TR, patent  foramen ovale - Patient currently on dialysis, for wound treatment - Not short of breath - Continue to monitor  Cerebral aneurysm -Per history, patient had hyperestrogenism, extensive IVH, stent placement 2022 - Large subdural hematoma 2023, purulent drainage - No acute clinical concern from surgical site - Has baseline mild cognitive decline  OSA (obstructive sleep apnea) -, Patient uses nightly oxygen at 2 L - OSA, sleep apnea, will add NIV protocol  Chronic respiratory failure with hypoxia (HCC) - as above uses nightly oxygen - uses inhalers at home - added  ESRD on dialysis    (CMD) - Known ESRD, follows [facility] for Monday Wednesday Friday dialysis - will request unattached nephro team for HD  PAD (peripheral artery disease) - known and follows vascular   DVT of upper extremity (deep vein thrombosis)    (CMD) DVT, along with multiple vascular issues from before - Patient on Eliquis  Weakness - Patient presenting with concerns of missing the floor, and not being is baseline self - Patient completed dialysis on 7/21, with improvement of his potassium - Awaiting PT evaluation, for safe discharge disposition      HPI   Jake Williams is a 58 y.o. male with a PMH significant for  HTN, HFpEF, PFO, ESRD on HD MWF, COPD w resp failure on 2L O2, BPH, chronic  Hepatitis B, Hx ruptured cerebral aneurysm 2010, extensive IVH s/p shunt placement 2022, large SDH s/p bur hole evacuation 2023, cognitive impairment, infrarenal AAA dissection s/p graft repair & revision of endoleak 2021, PE s/p IVC filter 2014, right IJ DVT on Eliquis, chronic pancytopenia, former smoker    presented to [facility] after family called EMS, noting that the patient was altered, more than his baseline. Patient was brought to the ED, where patient appears to be mildly confused about why he is in the hospital, is oriented to self, but is not oriented to place, denied any acute issues, and did  recall that he was supposed to be getting dialysis today Patient is not able to give much history beyond this.   Spoke to patient's sister Barnie with regards to his current ED visit, mentioned that patient was trying to use bathroom, and had messed it all up manage patient has had recent hospitalization, with changes to his medications, and she was not sure if medication changes were causing this, and called EMS   Per sister, patient is also supposed to follow-up at [facility] with regards to changing his dialysis access because of the right arm swelling, she is working on follow-up appointments, but is now confused as he needs to be in the hospital more often, not able to keep the appointments   At presentation patient, appears comfortable, not able to narrate much history, but does remember he needs dialysis, his right arm appears slightly swollen, but he is not in any discomfort Labs suggestive of CKD, ESRD, with concerns of elevated potassium at 6.6 Nephrology has been consulted from the ED, for need of dialysis  The primary contact is:  Extended Emergency Contact Information Primary Emergency Contact: Allen,Deborah Home Phone: [phone] Work Phone: [phone] Mobile Phone: [phone] Relation: Sister Secondary Emergency Contact: Primus,Tacara Mobile Phone: [phone] Relation: Daughter Preferred language: Unable to Obtain   Code Status: Full Code    DVT ppx: Subcutaneous Heparin   Current Diet:     Dietary Orders  (From admission, onward)               Ordered    Adult Diet- Renal; Dialysis (K/Phos/Na restricted)  Diet effective now       References:    Medical Nutrition Management (MNM) for Registered Dietitian  Question Answer Comment  Diet type: Renal   Renal Restriction: Dialysis (K/Phos/Na restricted)   Medical Nutrition Management By RD Yes, Medical Nutrition Management By RD      04/16/24 1303             Discharge Planning  The current  disposition plan is discharge to Home with home health in 24 hrs. Barriers to discharge: HD need Discharge follow up needed: Nephrology, PCP     Objective   Temp:  [97.8 F (36.6 C)-97.9 F (36.6 C)] 97.9 F (36.6 C) Heart Rate:  [59-72] 66 Resp:  [16-20] 19 BP: (122-185)/(92-108) 133/92   Intake/Output Summary (Last 24 hours) at 04/19/2024 1637 Last data filed at 04/19/2024 1616 Gross per 24 hour  Intake 720 ml  Output --  Net 720 ml      Physical Examination:  GEN: lying on bed, sister at bedside, during rounds,  has right chest dialysis catheter EYES: EOMI ENT: MMM CV: RRR, no murmurs appreciated PULM: CTA B ABD: soft, NT/ND, +BS EXT: No edema      Lines:  Patient Lines/Drains/Airways Status     Active Peripherally inserted central catheter / Central venous catheter / Peripheral intravenous  line / Drain / Airway / Intraosseous line / Epidural catheter / Arterial line / Line / Nasogastric/Orogastric Tube / Hemodialysis catheter / Umbilical venous catheter / Urethral Catheter / Intrauterine Pressure Catheter / Implantable Port / NHSN Urethral Catheter / NHSN Umbilical Catheter / Intentionally Retained Foreign Objects / AV Fistula / Peripheral Nerve Catheter     Name Placement date Placement time Site Days   Peripheral IV 04/16/24 20 G Left Forearm 04/16/24  1256  Forearm  3   Hemodialysis Cath Double Lumen Tunneled catheter Subclavian --  --  --  97   Hemodialysis Cath Double Lumen Tunneled catheter Subclavian --  --  --  --             Labs and Results  I have reviewed the following labs and studies Lab Results  Component Value Date   WBC 3.21 (L) 04/18/2024   HGB 9.5 (L) 04/18/2024   HCT 27.7 (L) 04/18/2024   MCV 82.3 04/18/2024   PLT 112 (L) 04/18/2024   Lab Results  Component Value Date   GLUCOSE 114 (H) 04/19/2024   CALCIUM  8.8 04/19/2024   NA 132 (L) 04/19/2024   K 4.4 04/19/2024   CO2 30 04/19/2024   CL 96 (L) 04/19/2024   BUN 17  04/19/2024   CREATININE 5.08 (H) 04/19/2024   Lab Results  Component Value Date   ALT 4 (L) 04/07/2024   AST 7 (L) 04/07/2024   BILITOT 0.6 04/07/2024   Lab Results  Component Value Date   INR 1.0 03/06/2024   INR 1.08 01/18/2022   INR 1.09 10/29/2021   PROTIME 12.9 03/06/2024   PROTIME 14.0 01/18/2022   PROTIME 14.1 10/29/2021    Micro: No results found for this visit on 04/16/24 (from the past week).      Medications  Scheduled Medications: amantadine, 200 mg, oral, Q7 Days amLODIPine , 10 mg, oral, QAM apixaban, 5 mg, oral, BID revefenacin, 175 mcg, nebulization, RDaily  And budesonide, 0.5 mg, nebulization, RBID  And formoterol, 20 mcg, nebulization, RBID cinacalcet, 30 mg, oral, Daily cloNIDine, 0.1 mg, oral, BID doxazosin, 4 mg, oral, Daily folic acid, 400 mcg, oral, Daily hydrALAZINE , 100 mg, oral, TID labetaloL, 300 mg, oral, BID lisinopriL , 40 mg, oral, Daily sodium zirconium cyclosilicate , 10 g, oral, Once   Prn Medications: .  dextrose .  dextrose .  docusate sodium .  gentamicin 0.3 mg/mL in sodium citrate 4% .  ipratropium-albuteroL  .  polyvinyl alcohol Continuous Medications: .     Electronically signed byGLENWOOD Derryl MARLA Deneen, MD 04/19/24 4:37 PM  This document was created using the aid of voice recognition Dragon dictation software.  Chances of interpretation errors are likely, despite attempts to correct errors

## 2024-04-19 NOTE — Progress Notes (Signed)
 NEPHROLOGY PROGRESS NOTE  ASSESSMENT/PLAN: ESRD.  HD MWF.  Will dialyze tomorrow on schedule. Hypertension, uncontrolled.  Blood pressure 163/100.  Will add Cardura 4 mg daily and cut back clonidine to 0.1 twice daily. Anemia of chronic disease. Hyperkalemia.  K+ 5.5 yesterday prior to dialysis.  On a renal diet.  Patient Jake Williams was seen and examined.  Patient Active Problem List   Diagnosis Date Noted  . Hyperkalemia 04/16/2024  . Edema of right upper arm 04/08/2024  . Bilateral leg edema 04/06/2024  . DVT of upper extremity (deep vein thrombosis)    (CMD) 03/10/2024  . General weakness 01/15/2024  . Impaired mobility and ADLs 01/14/2024  . Generalized weakness 01/13/2024  . Platelets decreased 06/14/2023  . PAD (peripheral artery disease) 06/13/2023  . Hypercalcemia 05/11/2023  . Loculated pleural effusion 08/25/2022  . Patent foramen ovale with right to left shunt (CMD) 07/13/2022  . Abnormality of gait 10/15/2021  . Injury to ligament of cervical spine 10/13/2021  . Intracranial hemorrhage (HCC) 04/01/2021  . 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 ESRD on dialysis    (CMD) 07/05/2020  . Rectal bleeding 07/05/2020  . Normocytic anemia 07/05/2020  . Chronic respiratory failure with hypoxia (HCC) 04/11/2020  . Lymphedema 04/11/2020  . Aneurysm of infrarenal abdominal aorta (HCC) 01/13/2020  . Endoleak post endovascular aneurysm repair 01/12/2020  . 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 Dependence on nocturnal oxygen therapy 05/09/2019  . COPD (chronic obstructive pulmonary disease)    (CMD) 04/06/2019  . Chronic heart failure with preserved ejection fraction (HFpEF)    (CMD) 06/09/2018  . History of hepatitis B 05/01/2018  . Tobacco use disorder 03/03/2013  . OSA (obstructive sleep apnea) 10/11/2011  . Erectile dysfunction 08/24/2011  . Hypertension 07/27/2011  . Cerebral aneurysm 07/27/2011     INTERVAL  HISTORY:  He feels well.  Has no questions.  Sister in the room.  MEDICATIONS: Current Medications[1]  PHYSICAL EXAM: Vitals: Temp:  [97.7 F (36.5 C)-98.2 F (36.8 C)] 97.9 F (36.6 C) Heart Rate:  [59-72] 63 Resp:  [16-18] 18 BP: (122-185)/(97-108) 163/100  WEIGHTS: Admission Weight: 90.7 kg (200 lb)documented Weight: 84.5 kg (186 lb 4.6 oz) Weight change:   Physical Exam: General: NAD. HEENT: Mucus membranes moist. Respiratory: Lungs clear. Cardiac: Normal S1&S2.  GI/Abd: Soft, normalBS. Urogenital: Deferred Musculoskeletal: No edema. Skin: Warm and dry. Dialysis Access: Stable Neuro: Alert, no focal deficit.    LAB DATA: Data Review: None Imaging: None  Lab Results  Component Value Date   NA 132 (L) 04/18/2024   K 5.5 (H) 04/18/2024   CL 98 04/18/2024   BUN 25 04/18/2024   CREATININE 6.62 (H) 04/18/2024   GLUCOSE 81 04/18/2024   Lab Results  Component Value Date   HGB 9.5 (L) 04/18/2024   HCT 27.7 (L) 04/18/2024   WBC 3.21 (L) 04/18/2024   Lab Results  Component Value Date   PHOS 4.4 04/16/2024                   [1] Current Facility-Administered Medications  Medication Dose Route Frequency Provider Last Rate Last Admin  . amantadine (SYMMETREL) capsule 200 mg  200 mg oral Q7 Days Santosh K Dhungana, MD   200 mg at 04/16/24 2133  . amLODIPine  (NORVASC ) tablet 10 mg  10 mg oral QAM Santosh K Dhungana, MD   10 mg at 04/19/24 0945  . apixaban (ELIQUIS) tablet 5 mg  5 mg  oral BID Santosh K Dhungana, MD   5 mg at 04/19/24 0945  . revefenacin (YUPELRI) 175 mcg/3 mL nebulizer solution 175 mcg  175 mcg nebulization RDaily Santosh K Dhungana, MD   175 mcg at 04/19/24 0855   And  . budesonide (PULMICORT) 0.5 mg/2 mL nebulizer solution 0.5 mg  0.5 mg nebulization RBID Santosh K Dhungana, MD   0.5 mg at 04/19/24 0856   And  . formoterol (PERFOROMIST) 20 mcg/2 mL nebulizer solution 20 mcg  20 mcg nebulization RBID Derryl MARLA Course, MD   20 mcg  at 04/19/24 0856  . cinacalcet (SENSIPAR) tablet 30 mg  30 mg oral Daily Santosh K Dhungana, MD   30 mg at 04/19/24 0945  . cloNIDine (CATAPRES) tablet 0.1 mg  0.1 mg oral TID Santosh K Dhungana, MD   0.1 mg at 04/19/24 0539  . dextrose (D50W) 50 % injection 12.5 g  12.5 g intravenous PRN Santosh K Dhungana, MD      . dextrose (GLUTOSE) 40 % oral gel 15 g  15 g oral PRN Derryl MARLA Course, MD      . docusate sodium (COLACE) capsule 100 mg  100 mg oral Daily PRN Santosh K Dhungana, MD   100 mg at 04/18/24 2115  . folic acid (FOLVITE) tablet 400 mcg  400 mcg oral Daily Santosh K Dhungana, MD   400 mcg at 04/19/24 0945  . gentamicin 0.3 mg/mL in sodium citrate 4% catheter dwell 3 mL  3 mL intra-catheter GIve in dialysis Jeanne Marie Zekan, MD   3 mL at 04/18/24 1233  . hydrALAZINE  (APRESOLINE ) tablet 100 mg  100 mg oral TID Santosh K Dhungana, MD   100 mg at 04/19/24 0539  . ipratropium-albuteroL  (DUO-NEB) 0.5-2.5 mg/3 mL nebulizer solution 3 mL  3 mL nebulization Q6H PRN Santosh MARLA Course, MD      . labetaloL (NORMODYNE) tablet 300 mg  300 mg oral BID Santosh K Dhungana, MD   300 mg at 04/19/24 0945  . lisinopriL  (PRINIVIL ) tablet 40 mg  40 mg oral Daily Santosh K Dhungana, MD   40 mg at 04/19/24 0945  . polyvinyl alcohol (LIQUIFILM TEARS) 1.4 % ophthalmic solution 1 drop  1 drop Both Eyes PRN Santosh K Dhungana, MD      . sodium zirconium cyclosilicate  (LOKELMA ) packet 10 g  10 g oral Once Leita Phenix, MD

## 2024-04-23 ENCOUNTER — Encounter (HOSPITAL_COMMUNITY)
Admission: RE | Disposition: A | Discharge status: Home / Self Care | Payer: Self-pay | Source: Home / Self Care | Attending: Vascular Surgery

## 2024-04-23 ENCOUNTER — Ambulatory Visit (HOSPITAL_COMMUNITY)
Admission: RE | Admit: 2024-04-23 | Discharge: 2024-04-23 | Disposition: A | Discharge status: Home / Self Care | Attending: Vascular Surgery | Admitting: Vascular Surgery

## 2024-04-23 ENCOUNTER — Other Ambulatory Visit: Payer: Self-pay

## 2024-04-23 DIAGNOSIS — N186 End stage renal disease: Secondary | ICD-10-CM | POA: Insufficient documentation

## 2024-04-23 DIAGNOSIS — E1122 Type 2 diabetes mellitus with diabetic chronic kidney disease: Secondary | ICD-10-CM | POA: Diagnosis not present

## 2024-04-23 DIAGNOSIS — Y839 Surgical procedure, unspecified as the cause of abnormal reaction of the patient, or of later complication, without mention of misadventure at the time of the procedure: Secondary | ICD-10-CM | POA: Insufficient documentation

## 2024-04-23 DIAGNOSIS — F17210 Nicotine dependence, cigarettes, uncomplicated: Secondary | ICD-10-CM | POA: Insufficient documentation

## 2024-04-23 DIAGNOSIS — T8249XA Other complication of vascular dialysis catheter, initial encounter: Secondary | ICD-10-CM | POA: Diagnosis not present

## 2024-04-23 DIAGNOSIS — I120 Hypertensive chronic kidney disease with stage 5 chronic kidney disease or end stage renal disease: Secondary | ICD-10-CM | POA: Insufficient documentation

## 2024-04-23 DIAGNOSIS — Z992 Dependence on renal dialysis: Secondary | ICD-10-CM | POA: Diagnosis not present

## 2024-04-23 LAB — GLUCOSE, CAPILLARY: Glucose-Capillary: 74 mg/dL (ref 70–99)

## 2024-04-23 SURGERY — TUNNELLED CATHETER EXCHANGE
Anesthesia: LOCAL

## 2024-04-23 MED ORDER — HEPARIN (PORCINE) IN NACL 2000-0.9 UNIT/L-% IV SOLN
INTRAVENOUS | Status: DC | PRN
  Administered 2024-04-23 (×2): 1900 mL

## 2024-04-23 MED ORDER — LIDOCAINE HCL (PF) 1 % IJ SOLN
INTRAMUSCULAR | Status: AC
Start: 2024-04-23 — End: 2024-04-23
  Filled 2024-04-23: qty 30

## 2024-04-23 MED ORDER — HEPARIN (PORCINE) IN NACL 1000-0.9 UT/500ML-% IV SOLN
INTRAVENOUS | Status: DC | PRN
  Administered 2024-04-23: 500 mL

## 2024-04-23 MED ORDER — HEPARIN SODIUM (PORCINE) 1000 UNIT/ML IJ SOLN
INTRAMUSCULAR | Status: AC
Start: 2024-04-23 — End: 2024-04-23
  Filled 2024-04-23: qty 10

## 2024-04-23 MED ORDER — LIDOCAINE HCL (PF) 1 % IJ SOLN
INTRAMUSCULAR | Status: DC | PRN
  Administered 2024-04-23: 10 mL

## 2024-04-23 SURGICAL SUPPLY — 4 items
CATH PALINDROME-P 23 W/VT (CATHETERS) IMPLANT
GLIDEWIRE STIFF .35X180X3 HYDR (WIRE) IMPLANT
MAT PREVALON FULL STRYKER (MISCELLANEOUS) IMPLANT
TRAY PV CATH (CUSTOM PROCEDURE TRAY) ×1 IMPLANT

## 2024-04-23 NOTE — Op Note (Signed)
 DATE OF SERVICE: 04/23/2024  PATIENT:  Jake Williams  58 y.o. male  PRE-OPERATIVE DIAGNOSIS:  end-stage renal disease; malfunction tunneled dialysis catheter   POST-OPERATIVE DIAGNOSIS:  Same   PROCEDURE:   1) Exchange of tunneled dialysis catheter (CPT [phone]) 2) established outpatient evaluation and management - level 3 (CPT 99213)   SURGEON:  Debby SAILOR. Magda, MD   ASSISTANT: none   ANESTHESIA:   local   ESTIMATED BLOOD LOSS: min   LOCAL MEDICATIONS USED:  LIDOCAINE     COUNTS: confirmed correct.   PATIENT DISPOSITION:  PACU - hemodynamically stable.   Delay start of Pharmacological VTE agent (>24hrs) due to surgical blood loss or risk of bleeding: no   INDICATION FOR PROCEDURE: Debby FORBES Seat is a 58 y.o. male with end-stage renal disease dialyzing through right internal jugular tunneled dialysis catheter which is malfunctioning. After careful discussion of risks, benefits, and alternatives the patient was offered exchange of tunneled dialysis catheter. The patient understood and wished to proceed.   OPERATIVE FINDINGS:  Successful exchange of tunneled dialysis catheter.  Catheter performed much better after exchange.   DESCRIPTION OF PROCEDURE: After identification of the patient in the pre-operative holding area, the patient was transferred to the operating room. The patient was positioned supine on the operating room table.  The right chest was was prepped and draped in standard fashion. A surgical pause was performed confirming correct patient, procedure, and operative location.   Generous infiltration of 1% lidocaine  was made around the catheter exit site.  Using Metzenbaum scissor scar between the existing catheter and the subcutaneous tissue was divided.  Once the catheter was free, zip wire was introduced into the right atrium.  Catheter change was then performed over the wire.  A new 23 cm palindrome catheter was delivered into good position.  The wire was removed.   The catheter was tested and found to flush and aspirate appropriately.  The catheter was secured in place with 2 nylon sutures.  Dermabond was applied to the exit site.   Upon completion of the case instrument and sharps counts were confirmed correct. The patient was transferred to the  PACU in good condition. I was present for all portions of the procedure.   PLAN: Okay to use catheter for dialysis.  Debby SAILOR. Magda, MD [facility] Phone Number: [phone] 04/23/2024 11:31 AM

## 2024-04-23 NOTE — Telephone Encounter (Signed)
 Copied from CRM #[id number]. Topic: Clinical Concerns - Home Health/Living Facility >> Apr 23, 2024  8:18 AM Harlene HERO wrote: Jake Williams is calling other request    Include all details related to the request(s) below: [facility] is calling in regards to patient being discharged from hospital. Patient was referred to nursing and physical therapy but refused. He was also referred to rehab but refused and wanted to go home. Patients daughter wants personal care through medicaid.     Confirm and type the Best Contact Number below:  Patient/caller contact number:   [phone]          [] Home  [] Mobile  [] Work [] Other   [x] Okay to leave a voicemail   Medication List:  Current Outpatient Medications:  .  acetaminophen  (TYLENOL ) 325 mg tablet, Take 2 tablets (650 mg total) by mouth every 6 (six) hours as needed for headaches, moderate pain (4-6), mild pain (1-3) or fever 100.4 F or GREATER., Disp: 30 tablet, Rfl: 0 .  albuterol  2.5 mg /3 mL (0.083 %) nebulizer solution, Take 2.5 mg by nebulization every 4 (four) hours as needed for wheezing or shortness of breath., Disp: 3 mL, Rfl: 2 .  amantadine (SYMMETREL) 100 mg capsule, TAKE TWO CAPSULES BY MOUTH EVERY WEEK ON MONDAY, Disp: 8 capsule, Rfl: 2 .  amLODIPine  (NORVASC ) 10 mg tablet, TAKE ONE TABLET BY MOUTH EVERY MORNING, Disp: 90 tablet, Rfl: 1 .  apixaban (ELIQUIS) 5 mg tab, Take 2 tablets (10 mg total) by mouth twice daily through evening of 03/15/24. Thereafter starting morning of 03/16/24 take 1 tablet (5 mg total) by mouth twice daily. (Patient taking differently: Take 5 mg by mouth 2 (two) times a day.), Disp: , Rfl:  .  blood pressure monitor kit, Use as instructed to check blood pressure daily., Disp: 1 each, Rfl: 0 .  cinacalcet (Sensipar) 30 mg tablet, Take 30 mg by mouth daily., Disp: , Rfl:  .  cloNIDine (CATAPRES) 0.1 mg tablet, Take 1 tablet (0.1 mg total) by mouth 2 (two) times a day., Disp: 180 tablet, Rfl: 0 .  [Paused]  docusate sodium (COLACE) 100 mg capsule, Take 100 mg by mouth daily as needed., Disp: , Rfl:  .  doxazosin (CARDURA) 4 mg tablet, Take 1 tablet (4 mg total) by mouth daily., Disp: 90 tablet, Rfl: 0 .  fluticasone-umeclidinium-vilanterol (Trelegy Ellipta) 100-62.5-25 mcg inhaler, Inhale 1 puff daily., Disp: , Rfl:  .  folic acid (FOLVITE) 400 mcg tablet, Take 1 tablet (400 mcg total) by mouth daily., Disp: , Rfl:  .  freestyle 28 gauge misc, 1 Units by miscellaneous route Once Daily., Disp: 100 each, Rfl: 0 .  glucose blood (FreeStyle Test) test strip, Use as instructed., Disp: 100 each, Rfl: 0 .  glucose monitoring kit kit, Use as instructed, Disp: 1 each, Rfl: 0 .  hydrALAZINE  (APRESOLINE ) 100 mg tablet, Take 100 mg by mouth 3 (three) times a day., Disp: , Rfl:  .  ipratropium-albuteroL  (DUO-NEB) 0.5-2.5 mg/3 mL nebulizer solution, Take 3 mL by nebulization every 6 (six) hours as needed for wheezing., Disp: , Rfl:  .  labetaloL (NORMODYNE) 300 mg tablet, TAKE ONE TABLET BY MOUTH TWICE A DAY, Disp: 180 tablet, Rfl: 1 .  lisinopriL  (PRINIVIL ) 40 mg tablet, TAKE ONE TABLET BY MOUTH ONE TIME DAILY RELATED TO ESSENTIAL (PRIMARY) HYPERTENSION, Disp: 30 tablet, Rfl: 0 .  polyvinyl alcohol (LIQUIFILM TEARS) 1.4 % ophthalmic solution, Administer 1 drop into each eyes as needed., Disp: 15 mL, Rfl: 0  Medication Request/Refills: Pharmacy Information (if applicable)   [] Not Applicable       []  Pharmacy listed  Send Medication Request to:                                                 [] Pharmacy not listed (added to pharmacy list in Epic) Send Medication Request to:      Listed Pharmacies: [facility] [id number] [place], [place] - [address] [place] AT [address] - PHONE: [phone] - FAX: [phone]

## 2024-04-23 NOTE — H&P (Signed)
 [facility] [place]  ASSESSMENT / PLAN: 58 y.o. male with end-stage renal disease dialyzing through tunneled dialysis catheter.  The patient needs tunneled dialysis catheter exchange per his dialysis center.  Will do this today in the Cath Lab.  CHIEF COMPLAINT: End-stage renal disease  HISTORY OF PRESENT ILLNESS: Jake Williams is a 58 y.o. male with end-stage renal disease dialyzing through a tunneled dialysis catheter.  Catheter is not performing well at dialysis, and so a catheter exchanges requested.  He presents the outpatient dialysis center for the same.  He has no complaints today.   Past Medical History:  Diagnosis Date   Cerebral aneurysm 07/27/2011   DM (diabetes mellitus) (HCC) 07/27/2011   HTN (hypertension) 07/27/2011    Past Surgical History:  Procedure Laterality Date   CEREBRAL ANEURYSM REPAIR      Family History  Problem Relation Age of Onset   Coronary artery disease Father    Heart disease Father    Cerebral aneurysm Sister    Hypertension Sister    Colon cancer Neg Hx    Prostate cancer Neg Hx     Social History   Socioeconomic History   Marital status: Single    Spouse name: Not on file   Number of children: Not on file   Years of education: Not on file   Highest education level: Not on file  Occupational History   Not on file  Tobacco Use   Smoking status: Every Day    Current packs/day: 0.25    Types: Cigarettes   Smokeless tobacco: Never  Vaping Use   Vaping status: Never Used  Substance and Sexual Activity   Alcohol use: Yes    Alcohol/week: 12.0 standard drinks of alcohol    Types: 12 Cans of beer per week   Drug use: No   Sexual activity: Yes  Other Topics Concern   Not on file  Social History Narrative   Not on file   Social Drivers of Health   Financial Resource Strain: Low Risk  (04/11/2020)   Received from [facility] visits prior to 11/27/2022.   Overall Financial Resource  Strain (CARDIA)    Difficulty of Paying Living Expenses: Not hard at all  Food Insecurity: Low Risk  (04/16/2024)   Received from [facility]   Hunger Vital Sign    Within the past 12 months, you worried that your food would run out before you got money to buy more: Never true    Within the past 12 months, the food you bought just didn't last and you didn't have money to get more. : Never true  Transportation Needs: No Transportation Needs (04/16/2024)   Received from [facility]    In the past 12 months, has lack of reliable transportation kept you from medical appointments, meetings, work or from getting things needed for daily living? : No  Physical Activity: Inactive (04/11/2020)   Received from [facility] visits prior to 11/27/2022.   Exercise Vital Sign    On average, how many days per week do you engage in moderate to strenuous exercise (like a brisk walk)?: 0 days    On average, how many minutes do you engage in exercise at this level?: 0 min  Stress: No Stress Concern Present (04/11/2020)   Received from [facility] visits prior to 11/27/2022.   [employer] of Occupational Health - Occupational Stress Questionnaire    Feeling  of Stress : Not at all  Social Connections: Unknown (02/09/2022)   Received from [facility]   Social Network    Social Network: Not on file  Intimate Partner Violence: Unknown (01/01/2022)   Received from [facility]   HITS    Physically Hurt: Not on file    Insult or Talk Down To: Not on file    Threaten Physical Harm: Not on file    Scream or Curse: Not on file    No Known Allergies  Current Facility-Administered Medications  Medication Dose Route Frequency Provider Last Rate Last Admin   Heparin  (Porcine) in NaCl 1000-0.9 UT/500ML-% SOLN    PRN Magda Debby SAILOR, MD   500 mL at 04/23/24 1122   Heparin  (Porcine) in NaCl 2000-0.9 UNIT/L-% SOLN    PRN Magda Debby SAILOR, MD   1,900 mL  at 04/23/24 1123   lidocaine  (PF) (XYLOCAINE ) 1 % injection    PRN Magda Debby SAILOR, MD   10 mL at 04/23/24 1122    PHYSICAL EXAM Vitals:   04/23/24 1109 04/23/24 1114 04/23/24 1119 04/23/24 1124  BP: (!) 135/92 (!) 133/91 (!) 134/91 (!) 134/93  Pulse: (!) 56 (!) 56 (!) 55 (!) 55  Resp:      Temp:      TempSrc:      SpO2: 97% 96% 96% 97%   Elderly man in no distress Regular rate and rhythm Unlabored breathing TDC in place right chest  PERTINENT LABORATORY AND RADIOLOGIC DATA  Most recent CBC    Latest Ref Rng & Units 04/13/2024   12:03 PM 09/08/2021    6:16 AM 09/05/2021    7:46 AM  CBC  WBC 4.0 - 10.5 K/uL 4.0  7.1  6.8   Hemoglobin 13.0 - 17.0 g/dL 9.8  9.4  9.6   Hematocrit 39.0 - 52.0 % 30.8  28.5  29.2   Platelets 150 - 400 K/uL 112  167  173      Most recent CMP    Latest Ref Rng & Units 04/13/2024    5:48 PM 09/08/2021    6:16 AM 09/05/2021    7:46 AM  CMP  Glucose 70 - 99 mg/dL 93  894  887   BUN 6 - 20 mg/dL 23  72  50   Creatinine 0.61 - 1.24 mg/dL 4.13  2.78  4.75   Sodium 135 - 145 mmol/L 134  131  130   Potassium 3.5 - 5.1 mmol/L 4.7  4.4  4.1   Chloride 98 - 111 mmol/L 97  95  95   CO2 22 - 32 mmol/L 29  26  25    Calcium  8.9 - 10.3 mg/dL 9.5  89.3  89.3    Hgb A1c MFr Bld (%)  Date Value  11/12/2019 4.9     Topher Buenaventura N. Magda, MD FACS [facility] Phone Number: [phone] 04/23/2024 11:32 AM   Total time spent on preparing this encounter including chart review, data review, collecting history, examining the patient, and coordinating care: 30 min  Portions of this report may have been transcribed using voice recognition software.  Every effort has been made to ensure accuracy; however, inadvertent computerized transcription errors may still be present.

## 2024-04-24 ENCOUNTER — Telehealth: Payer: Self-pay | Admitting: Vascular Surgery

## 2024-04-24 ENCOUNTER — Encounter (HOSPITAL_COMMUNITY): Payer: Self-pay | Admitting: Vascular Surgery

## 2024-04-24 MED FILL — Heparin Sodium (Porcine) Inj 1000 Unit/ML: INTRAMUSCULAR | Qty: 10 | Status: AC

## 2024-05-01 ENCOUNTER — Other Ambulatory Visit: Payer: Self-pay | Admitting: Vascular Surgery

## 2024-05-01 DIAGNOSIS — N186 End stage renal disease: Secondary | ICD-10-CM

## 2024-05-09 NOTE — Telephone Encounter (Signed)
 Pt appt scheduled

## 2024-05-22 ENCOUNTER — Inpatient Hospital Stay: Attending: Hematology & Oncology

## 2024-05-22 ENCOUNTER — Inpatient Hospital Stay: Admitting: Family

## 2024-05-22 ENCOUNTER — Other Ambulatory Visit: Payer: Self-pay | Admitting: Family

## 2024-05-22 DIAGNOSIS — D696 Thrombocytopenia, unspecified: Secondary | ICD-10-CM

## 2024-05-22 DIAGNOSIS — D619 Aplastic anemia, unspecified: Secondary | ICD-10-CM

## 2024-05-22 DIAGNOSIS — D72819 Decreased white blood cell count, unspecified: Secondary | ICD-10-CM

## 2024-05-22 DIAGNOSIS — D61818 Other pancytopenia: Secondary | ICD-10-CM

## 2024-05-22 DIAGNOSIS — D649 Anemia, unspecified: Secondary | ICD-10-CM

## 2024-06-04 ENCOUNTER — Other Ambulatory Visit (HOSPITAL_COMMUNITY): Payer: Self-pay | Admitting: Vascular Surgery

## 2024-06-04 DIAGNOSIS — Z01818 Encounter for other preprocedural examination: Secondary | ICD-10-CM

## 2024-06-04 NOTE — Progress Notes (Deleted)
 [facility] [place]  ASSESSMENT / PLAN: Jake Williams is a 58 y.o. *** handed male in need of permanent dialysis access. I reviewed options for dialysis in detail with the patient, including hemodialysis and peritoneal dialysis. I counseled the patient to ask their nephrologist about their candidacy for renal transplant. I counseled the patient that dialysis access requires surveillance and periodic maintenance. Plan to proceed with ***.    CHIEF COMPLAINT: ***  HISTORY OF PRESENT ILLNESS: Jake Williams is a 58 y.o. male ***  VASCULAR SURGICAL HISTORY: ***  VASCULAR RISK FACTORS: {FINDINGS; POSITIVE NEGATIVE:[id number]} history of stroke / transient ischemic attack. {FINDINGS; POSITIVE NEGATIVE:[phone]} history of coronary artery disease. *** history of PCI. *** history of CABG.  {FINDINGS; POSITIVE NEGATIVE:[id number]} history of diabetes mellitus. Last A1c ***. {FINDINGS; POSITIVE NEGATIVE:[id number]} history of smoking. *** actively smoking. {FINDINGS; POSITIVE NEGATIVE:[id number]} history of hypertension. *** drug regimen with *** control. {FINDINGS; POSITIVE NEGATIVE:[id number]} history of chronic kidney disease.  Last GFR ***. CKD {stage:30421363}. {FINDINGS; POSITIVE NEGATIVE:[phone]} history of chronic obstructive pulmonary disease, treated with ***.  FUNCTIONAL STATUS: ECOG performance status: {findings; ecog performance status:31780} Ambulatory status: {TNHAmbulation:25868}  CAREY 1 AND 3 YEAR INDEX Male (2pts) 75-79 or 80-84 (2pts) >84 (3pts) Dependence in toileting (1pt) Partial or full dependence in dressing (1pt) History of malignant neoplasm (2pts) CHF (3pts) COPD (1pts) CKD (3pts)  0-3 pts 6% 1 year mortality ; 21% 3 year mortality 4-5 pts 12% 1 year mortality ; 36% 3 year mortality >5 pts 21% 1 year mortality; 54% 3 year mortality  Past Medical History:  Diagnosis Date   Cerebral aneurysm 07/27/2011   DM (diabetes  mellitus) (HCC) 07/27/2011   HTN (hypertension) 07/27/2011    Past Surgical History:  Procedure Laterality Date   CEREBRAL ANEURYSM REPAIR     TUNNELLED CATHETER EXCHANGE N/A 04/23/2024   Procedure: TUNNELLED CATHETER EXCHANGE;  Surgeon: Magda Debby SAILOR, MD;  Location: [facility];  Service: Cardiovascular;  Laterality: N/A;    Family History  Problem Relation Age of Onset   Coronary artery disease Father    Heart disease Father    Cerebral aneurysm Sister    Hypertension Sister    Colon cancer Neg Hx    Prostate cancer Neg Hx     Social History   Socioeconomic History   Marital status: Single    Spouse name: Not on file   Number of children: Not on file   Years of education: Not on file   Highest education level: Not on file  Occupational History   Not on file  Tobacco Use   Smoking status: Every Day    Current packs/day: 0.25    Types: Cigarettes   Smokeless tobacco: Never  Vaping Use   Vaping status: Never Used  Substance and Sexual Activity   Alcohol use: Yes    Alcohol/week: 12.0 standard drinks of alcohol    Types: 12 Cans of beer per week   Drug use: No   Sexual activity: Yes  Other Topics Concern   Not on file  Social History Narrative   Not on file   Social Drivers of Health   Financial Resource Strain: Low Risk  (04/11/2020)   Received from [facility] visits prior to 11/27/2022.   Overall Financial Resource Strain (CARDIA)    Difficulty of Paying Living Expenses: Not hard at all  Food Insecurity: Low Risk  (04/16/2024)   Received from [facility]   Hunger Vital Sign  Within the past 12 months, you worried that your food would run out before you got money to buy more: Never true    Within the past 12 months, the food you bought just didn't last and you didn't have money to get more. : Never true  Transportation Needs: No Transportation Needs (04/16/2024)   Received from [facility]    In the past 12  months, has lack of reliable transportation kept you from medical appointments, meetings, work or from getting things needed for daily living? : No  Physical Activity: Inactive (04/11/2020)   Received from [facility] visits prior to 11/27/2022.   Exercise Vital Sign    On average, how many days per week do you engage in moderate to strenuous exercise (like a brisk walk)?: 0 days    On average, how many minutes do you engage in exercise at this level?: 0 min  Stress: No Stress Concern Present (04/11/2020)   Received from [facility] visits prior to 11/27/2022.   [employer] of Occupational [employer] - Occupational Stress Questionnaire    Feeling of Stress : Not at all  Social Connections: Unknown (02/09/2022)   Received from [facility]   Social Network    Social Network: Not on file  Intimate Partner Violence: Unknown (01/01/2022)   Received from [facility]   HITS    Physically Hurt: Not on file    Insult or Talk Down To: Not on file    Threaten Physical Harm: Not on file    Scream or Curse: Not on file    No Known Allergies  Current Outpatient Medications  Medication Sig Dispense Refill   amLODipine  (NORVASC ) 10 MG tablet Take 1 tablet (10 mg total) by mouth daily. 30 tablet 0   carvedilol  (COREG ) 12.5 MG tablet Take 1 tablet (12.5 mg total) by mouth 2 (two) times daily with a meal. 60 tablet 0   furosemide  (LASIX ) 20 MG tablet Take 1 tablet (20 mg total) by mouth daily. 30 tablet 0   hydrALAZINE  (APRESOLINE ) 50 MG tablet Take 1 tablet (50 mg total) by mouth every 8 (eight) hours. 90 tablet 0   ipratropium-albuterol  (DUONEB) 0.5-2.5 (3) MG/3ML SOLN Use twice a day scheduled and every 4 hours as needed for shortness of breath and wheezing 360 mL 0   isosorbide  mononitrate (IMDUR ) 60 MG 24 hr tablet Take 1 tablet (60 mg total) by mouth daily. 30 tablet 0   lisinopril  (ZESTRIL ) 40 MG tablet Take 1 tablet (40 mg total) by mouth daily. 30  tablet 0   No current facility-administered medications for this visit.    PHYSICAL EXAM There were no vitals filed for this visit. ***  PERTINENT LABORATORY AND RADIOLOGIC DATA  Most recent CBC    Latest Ref Rng & Units 04/13/2024   12:03 PM 09/08/2021    6:16 AM 09/05/2021    7:46 AM  CBC  WBC 4.0 - 10.5 K/uL 4.0  7.1  6.8   Hemoglobin 13.0 - 17.0 g/dL 9.8  9.4  9.6   Hematocrit 39.0 - 52.0 % 30.8  28.5  29.2   Platelets 150 - 400 K/uL 112  167  173      Most recent CMP    Latest Ref Rng & Units 04/13/2024    5:48 PM 09/08/2021    6:16 AM 09/05/2021    7:46 AM  CMP  Glucose 70 - 99 mg/dL 93  894  887  BUN 6 - 20 mg/dL 23  72  50   Creatinine 0.61 - 1.24 mg/dL 4.13  2.78  4.75   Sodium 135 - 145 mmol/L 134  131  130   Potassium 3.5 - 5.1 mmol/L 4.7  4.4  4.1   Chloride 98 - 111 mmol/L 97  95  95   CO2 22 - 32 mmol/L 29  26  25    Calcium  8.9 - 10.3 mg/dL 9.5  89.3  89.3     Renal function CrCl cannot be calculated (Patient's most recent lab result is older than the maximum 21 days allowed.).  Hgb A1c MFr Bld (%)  Date Value  11/12/2019 4.9    No results found for: LDLCALC, LDLC, HIRISKLDL, POCLDL, LDLDIRECT, REALLDLC, TOTLDLC   Vascular Imaging: ***  Sparsh Callens N. Magda, MD FACS [facility] Phone Number: [phone] 06/04/2024 8:27 PM   Total time spent on preparing this encounter including chart review, data review, collecting history, examining the patient, and coordinating care: {tnhtimebilling:26202} {billinglist:27273}  Portions of this report may have been transcribed using voice recognition software.  Every effort has been made to ensure accuracy; however, inadvertent computerized transcription errors may still be present.

## 2024-06-05 ENCOUNTER — Ambulatory Visit (HOSPITAL_COMMUNITY): Admission: RE | Admit: 2024-06-05 | Source: Ambulatory Visit

## 2024-06-05 ENCOUNTER — Ambulatory Visit (HOSPITAL_COMMUNITY): Attending: Vascular Surgery | Admitting: Vascular Surgery

## 2024-06-06 ENCOUNTER — Emergency Department (HOSPITAL_COMMUNITY): Admission: EM | Admit: 2024-06-06 | Discharge: 2024-06-06 | Discharge status: Against Medical Advice

## 2024-07-24 ENCOUNTER — Ambulatory Visit (HOSPITAL_COMMUNITY): Attending: Vascular Surgery

## 2024-07-24 ENCOUNTER — Ambulatory Visit: Admitting: Vascular Surgery

## 2024-07-24 ENCOUNTER — Ambulatory Visit (HOSPITAL_COMMUNITY)

## 2024-11-02 ENCOUNTER — Encounter (HOSPITAL_COMMUNITY): Payer: Self-pay | Admitting: Vascular Surgery
